# Patient Record
Sex: Female | Born: 1975
Health system: Southern US, Community
[De-identification: ages and names within clinical notes are randomized; demographics above are authoritative.]

## PROBLEM LIST (undated history)

## (undated) ENCOUNTER — Inpatient Hospital Stay (HOSPITAL_COMMUNITY): Payer: Self-pay

## (undated) DIAGNOSIS — O139 Gestational [pregnancy-induced] hypertension without significant proteinuria, unspecified trimester: Secondary | ICD-10-CM

## (undated) DIAGNOSIS — M5136 Other intervertebral disc degeneration, lumbar region: Secondary | ICD-10-CM

## (undated) DIAGNOSIS — K8020 Calculus of gallbladder without cholecystitis without obstruction: Secondary | ICD-10-CM

## (undated) DIAGNOSIS — M51369 Other intervertebral disc degeneration, lumbar region without mention of lumbar back pain or lower extremity pain: Secondary | ICD-10-CM

## (undated) HISTORY — DX: Other intervertebral disc degeneration, lumbar region: M51.36

## (undated) HISTORY — DX: Other intervertebral disc degeneration, lumbar region without mention of lumbar back pain or lower extremity pain: M51.369

## (undated) HISTORY — PX: CHOLECYSTECTOMY: SHX55

## (undated) HISTORY — DX: Calculus of gallbladder without cholecystitis without obstruction: K80.20

---

## 2004-12-12 ENCOUNTER — Other Ambulatory Visit: Admission: RE | Admit: 2004-12-12 | Discharge: 2004-12-12 | Payer: Self-pay | Admitting: Obstetrics and Gynecology

## 2005-06-09 ENCOUNTER — Emergency Department (HOSPITAL_COMMUNITY): Admission: EM | Admit: 2005-06-09 | Discharge: 2005-06-09 | Payer: Self-pay | Admitting: Family Medicine

## 2006-07-02 ENCOUNTER — Emergency Department (HOSPITAL_COMMUNITY): Admission: EM | Admit: 2006-07-02 | Discharge: 2006-07-02 | Payer: Self-pay | Admitting: *Deleted

## 2007-09-10 ENCOUNTER — Ambulatory Visit (HOSPITAL_COMMUNITY): Admission: RE | Admit: 2007-09-10 | Discharge: 2007-09-10 | Payer: Self-pay | Admitting: Family Medicine

## 2007-09-28 ENCOUNTER — Encounter (INDEPENDENT_AMBULATORY_CARE_PROVIDER_SITE_OTHER): Payer: Self-pay | Admitting: General Surgery

## 2007-09-28 ENCOUNTER — Ambulatory Visit (HOSPITAL_COMMUNITY): Admission: RE | Admit: 2007-09-28 | Discharge: 2007-09-28 | Payer: Self-pay | Admitting: General Surgery
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
## 2010-08-27 NOTE — Op Note (Signed)
Kimberly [name], Kimberly [name]               ACCOUNT NO.:  [id number]   MEDICAL RECORD NO.:  [id number]          PATIENT TYPE:  AMB   LOCATION:  SDS                          FACILITY:  [facility]   PHYSICIAN:  Ollen Gross. Vernell Morgans, M.D. DATE OF BIRTH:  1975-12-06   DATE OF PROCEDURE:  09/28/2007  DATE OF DISCHARGE:  09/28/2007                               OPERATIVE REPORT   PREOPERATIVE DIAGNOSIS:  Gallstones.   POSTOPERATIVE DIAGNOSIS:  Gallstones.   PROCEDURE:  Laparoscopic cholecystectomy with intraoperative  cholangiogram.   SURGEON:  Ollen Gross. Vernell Morgans, MD   ANESTHESIA:  General endotracheal.   PROCEDURE:  After informed consent was obtained, the patient was brought  to the operating room, placed in a supine position on the operating room  table.  After adequate induction of general anesthesia, the patient's  abdomen was prepped with Betadine and draped in usual sterile manner.  The area below the umbilicus was infiltrated with 0.25% Marcaine.  A  small incision was made with 15-blade knife.  This incision carried down  through the subcutaneous tissue bluntly with a hemostat and [employer]  retractors until the linea alba was identified.  The linea alba was  incised with a 15-blade knife and each side was grasped with Kocher  clamps and elevated anteriorly.  The preperitoneal space was then probed  bluntly hemostat until the peritoneum was opened and access was gained  to the abdominal cavity.  A 0 Vicryl pursestring stitch was placed in  the fascia around the opening .  A Hasson cannula was placed through the  opening and anchored in place at the previously placed Vicryl  pursestring stitch.  The abdomen was then insufflated with carbon  dioxide without difficulty.  The patient was placed in reverse  Trendelenburg position and rotated with the right side up.  The  laparoscope was inserted through the Hasson cannula and the right upper  quadrant was inspected.  The dome of the gallbladder and  the liver were  readily identified.  The epigastric area was then infiltrated with 0.25%  Marcaine.  A small incision was made with a 15-blade knife and a 10-mm  port was placed bluntly through this incision into the abdominal cavity  under direct vision.  Sites were then chosen laterally on the right side  with placement of 5-mm ports.  Each of these areas were infiltrated with  0.25% Marcaine.  Small stab incisions were made with a 15-blade knife  and 5-mm ports were placed bluntly through these incisions into the  abdominal cavity under direct vision.  A blunt grasper was placed  through the lateral-most 5-mm port and used to grasp the dome of the  gallbladder and elevated anteriorly and superiorly.  Another blunt  grasper was placed through the other 5-mm port and used to retract on  the body and neck of the gallbladder.  a dissector was placed through  the epigastric port and using the electrocautery, the peritoneal  reflection of the gallbladder neck was opened.  blunt dissection was  then carried out in this area into the gallbladder neck.  cystic  duct  junction was readily identified and a good window was created.  A single  clip was placed on the gallbladder neck.  A small ductotomy was made  just below the clip with a laparoscopic scissors.  A 14 gauge Angiocath  was then placed percutaneously through the anterior abdominal wall under  direct vision.  A Reddick cholangiogram catheter placed through the  Angiocath and flushed.  The Reddick catheter was then placed within the  cystic duct and anchored in place to the clip.  The cholangiogram was  obtained that showed no filling defects, good emptying in duodenum and  good length on the cystic duct.  The anchoring clip and catheters were  removed from the patient.  Three clips were placed proximally on the  cystic duct and the duct was divided between the 2 sets of clips.  Posterior to this, the cystic artery was identified and  again dissected  bluntly in a circumferential manner until a good window was created.  Two clips placed proximally and distally on the artery and the artery  was divided between the two.  Next, a laparoscopic hook cautery device  was used to separate the gallbladder from liver bed.  Prior to  completely detaching the gallbladder from the liver bed, the liver bed  was inspected and several small bleeding points were coagulated with the  electrocautery until the area was completely hemostatic.  The  gallbladder was then detached, arrest away from the liver bed without  difficulty using the hook cautery.  A laparoscopic bag was inserted  through the epigastric port.  The gallbladder was placed in the bag and  the bag was sealed.  The abdomen was then irrigated with copious amounts  of saline until the effluent was clear. The laparoscope was then removed  from the epigastric port.  A gallbladder grasper was placed through the  Hasson cannula and used to grasp open the bag.  The bag with the  gallbladder was removed through the infraumbilical port without  difficulty.  The fascial defect was then closed with a previously placed  Vicryl pursestring stitch as well as another figure-of-eight 0 Vicryl  stitch.  The rest of the ports were removed under direct vision and were  found to be hemostatic.  The gas was allowed to escape.  The skin  incisions were all closed with interrupted 4-0 Monocryl subcuticular  stitches.  Dermabond dressings were applied.  The patient tolerated the  procedure well.  At the end of the case, all needle, sponge, and  instrument counts were correct.  The patient was then awakened and taken  to the recovery room in stable condition.      Ollen Gross. Vernell Morgans, M.D.  Electronically Signed     PST/MEDQ  D:  09/28/2007  T:  09/29/2007  Job:  [id number]

## 2011-01-09 LAB — BASIC METABOLIC PANEL
BUN: 9
Calcium: 9.8
Creatinine, Ser: 0.57
GFR calc Af Amer: >60

## 2011-01-09 LAB — CBC
HCT: 38.5
Hemoglobin: 13.3
MCHC: 34.6
RDW: 11 — ABNORMAL LOW

## 2013-07-13 ENCOUNTER — Ambulatory Visit (INDEPENDENT_AMBULATORY_CARE_PROVIDER_SITE_OTHER): Payer: Managed Care, Other (non HMO) | Admitting: Obstetrics & Gynecology

## 2013-07-13 ENCOUNTER — Encounter: Payer: Self-pay | Admitting: Obstetrics & Gynecology

## 2013-07-13 VITALS — BP 151/103 | HR 74 | Resp 16 | Ht 69.0 in | Wt 203.0 lb

## 2013-07-13 DIAGNOSIS — Z0000 Encounter for general adult medical examination without abnormal findings: Secondary | ICD-10-CM

## 2013-07-13 DIAGNOSIS — Z124 Encounter for screening for malignant neoplasm of cervix: Secondary | ICD-10-CM

## 2013-07-13 DIAGNOSIS — Z1151 Encounter for screening for human papillomavirus (HPV): Secondary | ICD-10-CM

## 2013-07-13 DIAGNOSIS — Z01419 Encounter for gynecological examination (general) (routine) without abnormal findings: Secondary | ICD-10-CM

## 2013-07-13 MED ORDER — DROSPIRENONE-ETHINYL ESTRADIOL 3-0.02 MG PO TABS: 1.0000 | ORAL_TABLET | Freq: Every day | ORAL | Status: DC

## 2013-07-13 NOTE — Progress Notes (Signed)
Subjective:    Kimberly MasterJessica [name] is a 38 y.o. female who presents for an annual exam. The patient has no complaints today. She needs a refill of her generic Dianah FieldYaz (she is aware of the increased risks). The patient is sexually active. GYN screening history: last pap: was normal. The patient wears seatbelts: yes. The patient participates in regular exercise: yes. Has the patient ever been transfused or tattooed?: no. The patient reports that there is not domestic violence in her life.   Menstrual History: OB History   Grav Para Term Preterm Abortions TAB SAB Ect Mult Living   0 0 0 0 0 0 0 0 0 0       Menarche age: [age]  Patient's last menstrual period was 07/01/2013.    The following portions of the patient's history were reviewed and updated as appropriate: allergies, current medications, past family history, past medical history, past social history, past surgical history and problem list.  Review of Systems A comprehensive review of systems was negative. She says that her BP is always normal and she gets it checked fairly regularly. Her FP is at [facility]. Wants a pregancy in the near future but does want a refill of OCPs today. 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   Objective:    BP 151/103  Pulse 74  Resp 16  Ht 5\' 9"  (1.753 m)  Wt 92.08 kg (203 lb)  BMI 29.96 kg/m2  LMP 07/01/2013  General Appearance:    Alert, cooperative, no distress, appears stated age  Head:    Normocephalic, without obvious abnormality, atraumatic  Eyes:    PERRL, conjunctiva/corneas clear, EOM's intact, fundi    benign, both eyes  Ears:    Normal TM's and external ear canals, both ears  Nose:   Nares normal, septum midline, mucosa normal, no drainage    or sinus tenderness  Throat:   Lips, mucosa, and tongue normal; teeth and gums normal  Neck:   Supple, symmetrical, trachea midline, no adenopathy;    thyroid:  no enlargement/tenderness/nodules; no carotid   bruit or JVD  Back:      Symmetric, no curvature, ROM normal, no CVA tenderness  Lungs:     Clear to auscultation bilaterally, respirations unlabored  Chest Wall:    No tenderness or deformity   Heart:    Regular rate and rhythm, S1 and S2 normal, no murmur, rub   or gallop  Breast Exam:    No tenderness, masses, or nipple abnormality  Abdomen:     Soft, non-tender, bowel sounds active all four quadrants,    no masses, no organomegaly  Genitalia:    Normal female without lesion, discharge or tenderness, NSSA, NT, mobile, normal adnexal exam     Extremities:   Extremities normal, atraumatic, no cyanosis or edema  Pulses:   2+ and symmetric all extremities  Skin:   Skin color, texture, turgor normal, no rashes or lesions  Lymph nodes:   Cervical, supraclavicular, and axillary nodes normal  Neurologic:   CNII-XII intact, normal strength, sensation and reflexes    throughout  .    Assessment:    Healthy female exam.    Plan:     Breast self exam technique reviewed and patient encouraged to perform self-exam monthly. Thin prep Pap smear. with cotesting Continue PNVs

## 2014-07-21 ENCOUNTER — Telehealth: Payer: Self-pay | Admitting: General Practice

## 2014-07-21 NOTE — Telephone Encounter (Signed)
Patient called and left message stating she needs a refill on her Kimberly [name] and also needs an appt to be seen as well for her yearly, which she will call back for. She just needs enough to get her until then.

## 2014-07-25 ENCOUNTER — Other Ambulatory Visit: Payer: Self-pay | Admitting: *Deleted

## 2014-07-25 DIAGNOSIS — Z3041 Encounter for surveillance of contraceptive pills: Secondary | ICD-10-CM

## 2014-07-25 MED ORDER — DROSPIRENONE-ETHINYL ESTRADIOL 3-0.02 MG PO TABS: 1.0000 | ORAL_TABLET | Freq: Every day | ORAL | Status: DC

## 2014-07-25 NOTE — Telephone Encounter (Signed)
Rf on Loryna x 1 until her annual appt.

## 2014-08-10 ENCOUNTER — Encounter: Payer: Self-pay | Admitting: Obstetrics & Gynecology

## 2014-08-10 ENCOUNTER — Ambulatory Visit (INDEPENDENT_AMBULATORY_CARE_PROVIDER_SITE_OTHER): Payer: Managed Care, Other (non HMO) | Admitting: Obstetrics & Gynecology

## 2014-08-10 VITALS — BP 128/85 | HR 71 | Resp 16 | Ht 69.0 in | Wt 187.0 lb

## 2014-08-10 DIAGNOSIS — Z01419 Encounter for gynecological examination (general) (routine) without abnormal findings: Secondary | ICD-10-CM

## 2014-08-10 DIAGNOSIS — Z3041 Encounter for surveillance of contraceptive pills: Secondary | ICD-10-CM

## 2014-08-10 DIAGNOSIS — Z1151 Encounter for screening for human papillomavirus (HPV): Secondary | ICD-10-CM | POA: Diagnosis not present

## 2014-08-10 DIAGNOSIS — Z0000 Encounter for general adult medical examination without abnormal findings: Secondary | ICD-10-CM

## 2014-08-10 DIAGNOSIS — Z124 Encounter for screening for malignant neoplasm of cervix: Secondary | ICD-10-CM

## 2014-08-10 MED ORDER — DROSPIRENONE-ETHINYL ESTRADIOL 3-0.02 MG PO TABS: 1.0000 | ORAL_TABLET | Freq: Every day | ORAL | Status: DC
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
## 2014-08-15 LAB — CYTOLOGY - PAP

## 2015-03-19 ENCOUNTER — Other Ambulatory Visit: Payer: Self-pay | Admitting: Obstetrics & Gynecology

## 2015-08-07 ENCOUNTER — Other Ambulatory Visit: Payer: Self-pay | Admitting: Obstetrics & Gynecology

## 2015-08-09 ENCOUNTER — Other Ambulatory Visit: Payer: Self-pay | Admitting: *Deleted

## 2015-08-09 DIAGNOSIS — Z3041 Encounter for surveillance of contraceptive pills: Secondary | ICD-10-CM

## 2015-08-09 MED ORDER — DROSPIRENONE-ETHINYL ESTRADIOL 3-0.02 MG PO TABS: 1.0000 | ORAL_TABLET | Freq: Every day | ORAL | Status: DC

## 2015-08-09 NOTE — Telephone Encounter (Signed)
RF authorization sent to [facility] for Loryna.  Pt has appt in may for annual

## 2015-08-13 ENCOUNTER — Ambulatory Visit: Payer: Managed Care, Other (non HMO) | Admitting: Obstetrics & Gynecology

## 2015-08-20 ENCOUNTER — Ambulatory Visit (INDEPENDENT_AMBULATORY_CARE_PROVIDER_SITE_OTHER): Payer: PRIVATE HEALTH INSURANCE | Admitting: Obstetrics & Gynecology

## 2015-08-20 ENCOUNTER — Encounter: Payer: Self-pay | Admitting: Obstetrics & Gynecology

## 2015-08-20 VITALS — BP 162/93 | HR 90 | Ht 68.0 in | Wt 195.0 lb

## 2015-08-20 DIAGNOSIS — Z124 Encounter for screening for malignant neoplasm of cervix: Secondary | ICD-10-CM

## 2015-08-20 DIAGNOSIS — Z01419 Encounter for gynecological examination (general) (routine) without abnormal findings: Secondary | ICD-10-CM | POA: Diagnosis not present

## 2015-08-20 DIAGNOSIS — Z3041 Encounter for surveillance of contraceptive pills: Secondary | ICD-10-CM | POA: Diagnosis not present

## 2015-08-20 DIAGNOSIS — Z1151 Encounter for screening for human papillomavirus (HPV): Secondary | ICD-10-CM

## 2015-08-20 DIAGNOSIS — Z0000 Encounter for general adult medical examination without abnormal findings: Secondary | ICD-10-CM

## 2015-08-20 MED ORDER — RIZATRIPTAN BENZOATE 10 MG PO TABS: 10.0000 mg | ORAL_TABLET | ORAL | Status: DC | PRN

## 2015-08-20 NOTE — Progress Notes (Signed)
Subjective:    Kimberly [name] is a 40 y.o. MW G0 female who presents for an annual exam. The patient has no complaints today. The patient is sexually active. GYN screening history: last pap: was normal. The patient wears seatbelts: yes. The patient participates in regular exercise: yes. Has the patient ever been transfused or tattooed?: not asked. The patient reports that there is not domestic violence in her life.   Menstrual History: OB History    Gravida Para Term Preterm AB TAB SAB Ectopic Multiple Living   0 0 0 0 0 0 0 0 0 0       Menarche age: [age]  Patient's last menstrual period was 08/14/2015.    The following portions of the patient's history were reviewed and updated as appropriate: allergies, current medications, past family history, past medical history, past social history, past surgical history and problem list.  Review of Systems Pertinent items are noted in HPI. Recently married, monogamous for 5 years, planning a pregnancy this year.    Objective:    Pulse 90  Ht 5\' 8"  (1.727 m)  Wt 195 lb (88.451 kg)  BMI 29.66 kg/m2  LMP 08/14/2015  General Appearance:    Alert, cooperative, no distress, appears stated age  Head:    Normocephalic, without obvious abnormality, atraumatic  Eyes:    PERRL, conjunctiva/corneas clear, EOM's intact, fundi    benign, both eyes  Ears:    Normal TM's and external ear canals, both ears  Nose:   Nares normal, septum midline, mucosa normal, no drainage    or sinus tenderness  Throat:   Lips, mucosa, and tongue normal; teeth and gums normal  Neck:   Supple, symmetrical, trachea midline, no adenopathy;    thyroid:  no enlargement/tenderness/nodules; no carotid   bruit or JVD  Back:     Symmetric, no curvature, ROM normal, no CVA tenderness  Lungs:     Clear to auscultation bilaterally, respirations unlabored  Chest Wall:    No tenderness or deformity   Heart:    Regular rate and rhythm, S1 and S2 normal, no murmur, rub   or gallop   Breast Exam:    No tenderness, masses, or nipple abnormality  Abdomen:     Soft, non-tender, bowel sounds active all four quadrants,    no masses, no organomegaly  Genitalia:    Normal female without lesion, discharge or tenderness, NSSA, NT, mobile, normal adnexal exam     Extremities:   Extremities normal, atraumatic, no cyanosis or edema  Pulses:   2+ and symmetric all extremities  Skin:   Skin color, texture, turgor normal, no rashes or lesions  Lymph nodes:   Cervical, supraclavicular, and axillary nodes normal  Neurologic:   CNII-XII intact, normal strength, sensation and reflexes    throughout  .    Assessment:    Healthy female exam.    Plan:     Thin prep Pap smear. with cotesting Continue PNVs daily Stop OCPs when she desires pregnancy We discussed the average time for conception of 1 year

## 2015-08-22 LAB — CYTOLOGY - PAP

## 2015-08-31 ENCOUNTER — Ambulatory Visit: Payer: PRIVATE HEALTH INSURANCE

## 2015-08-31 VITALS — BP 122/80 | HR 88

## 2015-08-31 DIAGNOSIS — Z0130 Encounter for examination of blood pressure without abnormal findings: Secondary | ICD-10-CM

## 2015-08-31 NOTE — Progress Notes (Signed)
Patient presents for blood pressure check. Patient made aware within normal range. Kimberly StammerJennifer Jizelle Conkey RN BSN

## 2015-10-04 ENCOUNTER — Telehealth (HOSPITAL_COMMUNITY): Payer: Self-pay | Admitting: Obstetrics & Gynecology

## 2015-10-04 DIAGNOSIS — Z3041 Encounter for surveillance of contraceptive pills: Secondary | ICD-10-CM

## 2015-10-04 MED ORDER — DROSPIRENONE-ETHINYL ESTRADIOL 3-0.02 MG PO TABS: 1.0000 | ORAL_TABLET | Freq: Every day | ORAL | Status: DC

## 2015-10-04 NOTE — Telephone Encounter (Signed)
Patient called regarding her BC refills.  She was told the refill was sent in on 08/20/15 at the time of her annual exam.  Did not show refill was generated.  [facility] [place].

## 2015-10-25 ENCOUNTER — Other Ambulatory Visit: Payer: Self-pay | Admitting: Obstetrics & Gynecology

## 2015-12-11 ENCOUNTER — Telehealth: Payer: Self-pay | Admitting: *Deleted

## 2015-12-11 DIAGNOSIS — G43911 Migraine, unspecified, intractable, with status migrainosus: Secondary | ICD-10-CM

## 2015-12-11 MED ORDER — RIZATRIPTAN BENZOATE 10 MG PO TABS: 10.0000 mg | ORAL_TABLET | ORAL | 2 refills | Status: DC | PRN

## 2015-12-11 NOTE — Telephone Encounter (Signed)
Pt called and requested her Maxalt be sent to [facility] for a 90 day supply.

## 2016-02-07 ENCOUNTER — Encounter: Payer: Self-pay | Admitting: *Deleted

## 2016-04-14 NOTE — L&D Delivery Note (Signed)
Patient is 41 y.o. G1P0000 4752w1d admitted for IOL for preeclampsia with severe features. S/p IOL with foley bulb, cytotec, followed by Pitocin. AROM at 1739.   Delivery Note At 2:09 AM a viable female was delivered via Vaginal, Spontaneous Delivery (Presentation: cephalic; ROA).  APGAR: 9, 9; weight pending.   Placenta status: grossly normal, intact.  Cord: 3 vessel without complications.  Cord pH: not drawn.  Anesthesia: Epidural Episiotomy: None Lacerations: 2nd degree;Perineal Suture Repair: vicryl rapide Est. Blood Loss (mL):  475  Upon arrival patient was complete and pushing. She pushed with good maternal effort to deliver a viable female infant in cephalic, ROA position. No nuchal cord present.  Baby delivered without difficulty (anterior shoulder delivered with ease), was noted to have good tone and placed on maternal abdomen for oral suctioning, drying and stimulation. Delayed cord clamping performed. Placenta delivered spontaneously with gentle cord traction. Fundus firm with massage and Pitocin. Perineum inspected and found to have deep 2nd degree perineal laceration and two bilateral sulci lacerations, which were repaired with vicryl rapide with good hemostasis achieved. Counts of sharps, instruments, and lap pads were all correct.  Mom to postpartum.  Baby to Couplet care / Skin to Skin.  Amanda C. Frances FurbishWinfrey, MD PGY-1, [facility] 12/18/2016 2:44 AM

## 2016-05-06 ENCOUNTER — Telehealth: Payer: Self-pay

## 2016-05-06 NOTE — Telephone Encounter (Signed)
Pt called and was concerned because she is [redacted] weeks pregnant and went to urinate and saw blood when she wiped. I asked her if she had recently had intercourse and she stated that she had not. She stated that this was the first time she had noticed any bleeding since becoming pregnant. I asked her if she was experiencing any cramping or any pain at all and she denied feeling any pain. I spoke with Mariel AloeLora Clark, RN and she told me that sometimes spotting can be normal in pregnancy but, to tell the pt to go to ED if the bleeding increased. The pt asked to speak to a doctor so Elsie LincolnKelly Leggett, MD spoke with her on the phone. Dr.Leggett explained ectopic precautions to her and told her that if she felt the need to go the ED then she can do so but, we could draw BHCG and sent it stat tomorrow morning. Dr.Leggett told her to go to the hospital if bleeding increased or if she started to experience pain.

## 2016-05-07 ENCOUNTER — Other Ambulatory Visit (INDEPENDENT_AMBULATORY_CARE_PROVIDER_SITE_OTHER): Payer: PRIVATE HEALTH INSURANCE

## 2016-05-07 DIAGNOSIS — O209 Hemorrhage in early pregnancy, unspecified: Secondary | ICD-10-CM | POA: Diagnosis not present

## 2016-05-08 LAB — HCG, QUANTITATIVE, PREGNANCY: hCG, Beta Chain, Quant, S: 25473.7 m[IU]/mL — ABNORMAL HIGH

## 2016-05-09 ENCOUNTER — Other Ambulatory Visit: Payer: PRIVATE HEALTH INSURANCE

## 2016-05-20 ENCOUNTER — Ambulatory Visit (INDEPENDENT_AMBULATORY_CARE_PROVIDER_SITE_OTHER): Payer: PRIVATE HEALTH INSURANCE | Admitting: Obstetrics & Gynecology

## 2016-05-20 ENCOUNTER — Encounter: Payer: Self-pay | Admitting: Obstetrics & Gynecology

## 2016-05-20 VITALS — BP 129/85 | HR 75 | Wt 193.0 lb

## 2016-05-20 DIAGNOSIS — O09511 Supervision of elderly primigravida, first trimester: Secondary | ICD-10-CM

## 2016-05-20 DIAGNOSIS — O09513 Supervision of elderly primigravida, third trimester: Secondary | ICD-10-CM | POA: Insufficient documentation

## 2016-05-20 DIAGNOSIS — Z3689 Encounter for other specified antenatal screening: Secondary | ICD-10-CM

## 2016-05-20 DIAGNOSIS — Z3403 Encounter for supervision of normal first pregnancy, third trimester: Secondary | ICD-10-CM | POA: Insufficient documentation

## 2016-05-20 DIAGNOSIS — Z3491 Encounter for supervision of normal pregnancy, unspecified, first trimester: Secondary | ICD-10-CM

## 2016-05-20 NOTE — Progress Notes (Signed)
  PanormaBedside U/S today shows IUP with FHT of sac 1 of 168 BPM and CRL is 16.526mm  Sac 2 shows smal gestational sac with yolk sac but no fetal pole.

## 2016-05-20 NOTE — Progress Notes (Signed)
  Subjective:    Kimberly [name] is a MW G1P0000 4070w1d being seen today for her first obstetrical visit.  Her obstetrical history is significant for advanced maternal age and obesity. Patient does intend to breast feed. Pregnancy history fully reviewed.  Patient reports no complaints.  Vitals:   05/20/16 1354  BP: 129/85  Pulse: 75  Weight: 193 lb (87.5 kg)    HISTORY: OB History  Gravida Para Term Preterm AB Living  1 0 0 0 0 0  SAB TAB Ectopic Multiple Live Births  0 0 0 0      # Outcome Date GA Lbr Len/2nd Weight Sex Delivery Anes PTL Lv  1 Current              Past Medical History:  Diagnosis Date  . Gallstones    Past Surgical History:  Procedure Laterality Date  . CHOLECYSTECTOMY     Family History  Problem Relation Age of Onset  . Heart disease Mother   . Hypertension Father   . Heart disease Father   . Hypertension Maternal Grandmother      Exam    Uterus:     Pelvic Exam:    Perineum: No Hemorrhoids   Vulva: normal   Vagina:  normal mucosa   pH:    Cervix: anteverted   Adnexa: normal adnexa   Bony Pelvis: android  System: Breast:  normal appearance, no masses or tenderness   Skin: normal coloration and turgor, no rashes    Neurologic: oriented   Extremities: normal strength, tone, and muscle mass   HEENT PERRLA   Mouth/Teeth mucous membranes moist, pharynx normal without lesions   Neck supple   Cardiovascular: regular rate and rhythm   Respiratory:  appears well, vitals normal, no respiratory distress, acyanotic, normal RR, ear and throat exam is normal, neck free of mass or lymphadenopathy, chest clear, no wheezing, crepitations, rhonchi, normal symmetric air entry   Abdomen: soft, non-tender; bowel sounds normal; no masses,  no organomegaly   Urinary: urethral meatus normal      Assessment:    Pregnancy: G1P0000 Patient Active Problem List   Diagnosis Date Noted  . Encounter for supervision of normal pregnancy in first trimester  05/20/2016  . Elderly primigravida in first trimester 05/20/2016        Plan:     Initial labs drawn. Prenatal vitamins. Problem list reviewed and updated. Genetic Screening discussed: plans for NIPS and MSAFP  Ultrasound discussed; fetal survey: requested.  Follow up in 4 weeks. Discussed rec'd weight gain of less than 25#  Usha Slager C Darilyn Storbeck 05/20/2016

## 2016-05-21 LAB — PRENATAL PROFILE (SOLSTAS)
ANTIBODY SCREEN: NEGATIVE
BASOS PCT: 0 %
Basophils Absolute: 0 cells/uL (ref 0–200)
EOS ABS: 146 {cells}/uL (ref 15–500)
Eosinophils Relative: 2 %
HCT: 36 % (ref 35.0–45.0)
HIV 1&2 Ab, 4th Generation: NONREACTIVE
Hemoglobin: 12.3 g/dL (ref 11.7–15.5)
Hepatitis B Surface Ag: NEGATIVE
LYMPHS PCT: 34 %
Lymphs Abs: 2482 cells/uL (ref 850–3900)
MCH: 32.5 pg (ref 27.0–33.0)
MCHC: 34.2 g/dL (ref 32.0–36.0)
MCV: 95.2 fL (ref 80.0–100.0)
MONOS PCT: 7 %
MPV: 9.7 fL (ref 7.5–12.5)
Monocytes Absolute: 511 cells/uL (ref 200–950)
NEUTROS PCT: 57 %
Neutro Abs: 4161 cells/uL (ref 1500–7800)
PLATELETS: 247 10*3/uL (ref 140–400)
RBC: 3.78 MIL/uL — ABNORMAL LOW (ref 3.80–5.10)
RDW: 12.3 % (ref 11.0–15.0)
RH TYPE: POSITIVE
WBC: 7.3 10*3/uL (ref 3.8–10.8)

## 2016-05-21 LAB — HEMOGLOBIN A1C
HEMOGLOBIN A1C: 4.7 % (ref <5.7)
MEAN PLASMA GLUCOSE: 88 mg/dL

## 2016-05-21 LAB — PAIN MGMT, PROFILE 6 CONF W/O MM, U
6 ACETYLMORPHINE: NEGATIVE ng/mL (ref <10)
AMPHETAMINES: NEGATIVE ng/mL (ref <500)
Alcohol Metabolites: NEGATIVE ng/mL (ref <500)
BARBITURATES: NEGATIVE ng/mL (ref <300)
Benzodiazepines: NEGATIVE ng/mL (ref <100)
Cocaine Metabolite: NEGATIVE ng/mL (ref <150)
Creatinine: 90 mg/dL (ref >=20.0)
Marijuana Metabolite: NEGATIVE ng/mL (ref <20)
Methadone Metabolite: NEGATIVE ng/mL (ref <100)
OPIATES: NEGATIVE ng/mL (ref <100)
OXIDANT: NEGATIVE ug/mL (ref <200)
Oxycodone: NEGATIVE ng/mL (ref <100)
PLEASE NOTE: 0
Phencyclidine: NEGATIVE ng/mL (ref <25)
pH: 7.03 (ref 4.5–9.0)

## 2016-05-21 LAB — GLUCOSE, RANDOM: GLUCOSE: 97 mg/dL (ref 65–99)

## 2016-05-22 LAB — GC/CHLAMYDIA PROBE AMP (~~LOC~~) NOT AT ARMC
Chlamydia: NEGATIVE
Neisseria Gonorrhea: NEGATIVE

## 2016-05-22 LAB — CULTURE, OB URINE: ORGANISM ID, BACTERIA: NO GROWTH

## 2016-05-29 LAB — CFVANTAGE CYSTIC FIB EXP SCREEN

## 2016-06-09 ENCOUNTER — Encounter: Payer: Self-pay | Admitting: *Deleted

## 2016-06-18 ENCOUNTER — Ambulatory Visit (INDEPENDENT_AMBULATORY_CARE_PROVIDER_SITE_OTHER): Payer: PRIVATE HEALTH INSURANCE | Admitting: Obstetrics & Gynecology

## 2016-06-18 VITALS — Wt 195.0 lb

## 2016-06-18 DIAGNOSIS — O09511 Supervision of elderly primigravida, first trimester: Secondary | ICD-10-CM

## 2016-06-18 DIAGNOSIS — Z3491 Encounter for supervision of normal pregnancy, unspecified, first trimester: Secondary | ICD-10-CM

## 2016-06-18 NOTE — Progress Notes (Signed)
   PRENATAL VISIT NOTE  Subjective:  Kimberly [name] is a 41 y.o.MW  G1P0000 at 4065w2d being seen today for ongoing prenatal care.  She is currently monitored for the following issues for this low-risk pregnancy and has Encounter for supervision of normal pregnancy in first trimester and Elderly primigravida in first trimester on her problem list.  Patient reports no complaints.   . Vag. Bleeding: None.  Movement: Absent. Denies leaking of fluid.   The following portions of the patient's history were reviewed and updated as appropriate: allergies, current medications, past family history, past medical history, past social history, past surgical history and problem list. Problem list updated.  Objective:   Vitals:   06/18/16 0830  Weight: 195 lb (88.5 kg)    Fetal Status: Fetal Heart Rate (bpm): 164   Movement: Absent     General:  Alert, oriented and cooperative. Patient is in no acute distress.  Skin: Skin is warm and dry. No rash noted.   Cardiovascular: Normal heart rate noted  Respiratory: Normal respiratory effort, no problems with respiration noted  Abdomen: Soft, gravid, appropriate for gestational age. Pain/Pressure: Present     Pelvic:  Cervical exam deferred        Extremities: Normal range of motion.  Edema: None  Mental Status: Normal mood and affect. Normal behavior. Normal judgment and thought content.   Assessment and Plan:  Pregnancy: G1P0000 at 4065w2d  1. Elderly primigravida in first trimester - NIPS today, MSAFP at next visit  2. Encounter for supervision of normal pregnancy in first trimester, unspecified gravidity   Preterm labor symptoms and general obstetric precautions including but not limited to vaginal bleeding, contractions, leaking of fluid and fetal movement were reviewed in detail with the patient. Please refer to After Visit Summary for other counseling recommendations.  No Follow-up on file.   Allie BossierMyra C Laura-Lee Villegas, MD

## 2016-06-30 ENCOUNTER — Encounter: Payer: Self-pay | Admitting: *Deleted

## 2016-07-15 ENCOUNTER — Encounter: Payer: PRIVATE HEALTH INSURANCE | Admitting: Obstetrics & Gynecology

## 2016-07-18 ENCOUNTER — Ambulatory Visit (INDEPENDENT_AMBULATORY_CARE_PROVIDER_SITE_OTHER): Payer: PRIVATE HEALTH INSURANCE | Admitting: Advanced Practice Midwife

## 2016-07-18 VITALS — BP 131/78 | Wt 198.0 lb

## 2016-07-18 DIAGNOSIS — O09522 Supervision of elderly multigravida, second trimester: Secondary | ICD-10-CM

## 2016-07-18 DIAGNOSIS — Z3401 Encounter for supervision of normal first pregnancy, first trimester: Secondary | ICD-10-CM

## 2016-07-18 DIAGNOSIS — Z363 Encounter for antenatal screening for malformations: Secondary | ICD-10-CM

## 2016-07-18 DIAGNOSIS — Z3A18 18 weeks gestation of pregnancy: Secondary | ICD-10-CM

## 2016-07-18 DIAGNOSIS — Z3491 Encounter for supervision of normal pregnancy, unspecified, first trimester: Secondary | ICD-10-CM

## 2016-07-18 NOTE — Patient Instructions (Signed)

## 2016-07-21 LAB — ALPHA FETOPROTEIN, MATERNAL
AFP: 26.5 ng/mL
Curr Gest Age: 16.6 weeks
MoM for AFP: 0.77
OPEN SPINA BIFIDA: NEGATIVE

## 2016-07-22 NOTE — Progress Notes (Signed)
   PRENATAL VISIT NOTE  Subjective:  KATELYNNE REVAK is a 41 y.o. G1P0000 at [redacted]w[redacted]d being seen today for ongoing prenatal care.  She is currently monitored for the following issues for this high-risk pregnancy and has Encounter for supervision of normal pregnancy in first trimester and Elderly primigravida in first trimester on her problem list.  Patient reports no complaints.  Contractions: Not present. Vag. Bleeding: None.  Movement: Absent. Denies leaking of fluid.   The following portions of the patient's history were reviewed and updated as appropriate: allergies, current medications, past family history, past medical history, past social history, past surgical history and problem list. Problem list updated.  Objective:   Vitals:   07/18/16 0820  BP: 131/78  Weight: 198 lb (89.8 kg)    Fetal Status: Fetal Heart Rate (bpm): 147 Fundal Height: 18 cm Movement: Absent     General:  Alert, oriented and cooperative. Patient is in no acute distress.  Skin: Skin is warm and dry. No rash noted.   Cardiovascular: Normal heart rate noted  Respiratory: Normal respiratory effort, no problems with respiration noted  Abdomen: Soft, gravid, appropriate for gestational age. Pain/Pressure: Present     Pelvic:  Cervical exam deferred        Extremities: Normal range of motion.  Edema: None  Mental Status: Normal mood and affect. Normal behavior. Normal judgment and thought content.   Assessment and Plan:  Pregnancy: G1P0000 at [redacted]w[redacted]d  1. Encounter for supervision of normal pregnancy in first trimester, unspecified gravidity  - Alpha fetoprotein, maternal - [place] MFM OB DETAIL +14 WK; Future  2. AMA (advanced maternal age) multigravida 35+, second trimester  - [place] [facility] OB DETAIL +14 WK; Future  3. Antenatal screening for malformation using ultrasonics  - [place] [facility] DETAIL +14 WK; Future  4. [redacted] weeks gestation of pregnancy  - [place] [facility] OB DETAIL +14 WK; Future  Preterm labor symptoms and general  obstetric precautions including but not limited to vaginal bleeding, contractions, leaking of fluid and fetal movement were reviewed in detail with the patient. Please refer to After Visit Summary for other counseling recommendations.  Return in about 4 weeks (around 08/15/2016) for ROB.   Dorathy Kinsman, CNM

## 2016-08-05 ENCOUNTER — Other Ambulatory Visit: Payer: Self-pay | Admitting: Advanced Practice Midwife

## 2016-08-05 ENCOUNTER — Encounter (HOSPITAL_COMMUNITY): Payer: Self-pay

## 2016-08-05 ENCOUNTER — Other Ambulatory Visit (HOSPITAL_COMMUNITY): Payer: Self-pay | Admitting: *Deleted

## 2016-08-05 ENCOUNTER — Ambulatory Visit (HOSPITAL_COMMUNITY)
Admission: RE | Admit: 2016-08-05 | Discharge: 2016-08-05 | Disposition: A | Discharge status: Home / Self Care | Payer: PRIVATE HEALTH INSURANCE | Source: Ambulatory Visit | Attending: Advanced Practice Midwife | Admitting: Advanced Practice Midwife

## 2016-08-05 DIAGNOSIS — Z3491 Encounter for supervision of normal pregnancy, unspecified, first trimester: Secondary | ICD-10-CM

## 2016-08-05 DIAGNOSIS — Z3A18 18 weeks gestation of pregnancy: Secondary | ICD-10-CM | POA: Diagnosis not present

## 2016-08-05 DIAGNOSIS — O99212 Obesity complicating pregnancy, second trimester: Secondary | ICD-10-CM | POA: Diagnosis not present

## 2016-08-05 DIAGNOSIS — O09522 Supervision of elderly multigravida, second trimester: Secondary | ICD-10-CM

## 2016-08-05 DIAGNOSIS — O09512 Supervision of elderly primigravida, second trimester: Secondary | ICD-10-CM | POA: Insufficient documentation

## 2016-08-05 DIAGNOSIS — Z363 Encounter for antenatal screening for malformations: Secondary | ICD-10-CM

## 2016-08-13 ENCOUNTER — Ambulatory Visit (INDEPENDENT_AMBULATORY_CARE_PROVIDER_SITE_OTHER): Payer: PRIVATE HEALTH INSURANCE | Admitting: Obstetrics & Gynecology

## 2016-08-13 VITALS — BP 128/74 | HR 78 | Wt 199.0 lb

## 2016-08-13 DIAGNOSIS — Z3401 Encounter for supervision of normal first pregnancy, first trimester: Secondary | ICD-10-CM

## 2016-08-13 DIAGNOSIS — O09511 Supervision of elderly primigravida, first trimester: Secondary | ICD-10-CM

## 2016-08-13 NOTE — Progress Notes (Signed)
   PRENATAL VISIT NOTE  Subjective:  Kimberly [name] is a 41 y.o. G1P0000 at [redacted]w[redacted]d being seen today for ongoing prenatal care.  She is currently monitored for the following issues for this low-risk pregnancy and has Encounter for supervision of normal pregnancy in first trimester and Elderly primigravida in first trimester on her problem list.  Patient reports no complaints.  Contractions: Not present. Vag. Bleeding: None.  Movement: Present. Denies leaking of fluid.   The following portions of the patient's history were reviewed and updated as appropriate: allergies, current medications, past family history, past medical history, past social history, past surgical history and problem list. Problem list updated.  Objective:   Vitals:   08/13/16 0847  BP: 128/74  Pulse: 78  Weight: 199 lb (90.3 kg)    Fetal Status:     Movement: Present     General:  Alert, oriented and cooperative. Patient is in no acute distress.  Skin: Skin is warm and dry. No rash noted.   Cardiovascular: Normal heart rate noted  Respiratory: Normal respiratory effort, no problems with respiration noted  Abdomen: Soft, gravid, appropriate for gestational age. Pain/Pressure: Present     Pelvic:  Cervical exam deferred        Extremities: Normal range of motion.  Edema: None  Mental Status: Normal mood and affect. Normal behavior. Normal judgment and thought content.   Assessment and Plan:  Pregnancy: G1P0000 at [redacted]w[redacted]d  1. Elderly primigravida in first trimester   2. Encounter for supervision of normal first pregnancy in first trimester   Preterm labor symptoms and general obstetric precautions including but not limited to vaginal bleeding, contractions, leaking of fluid and fetal movement were reviewed in detail with the patient. Please refer to After Visit Summary for other counseling recommendations.  No Follow-up on file.   Allie Bossier, MD

## 2016-08-14 ENCOUNTER — Encounter: Payer: PRIVATE HEALTH INSURANCE | Admitting: Obstetrics & Gynecology

## 2016-09-16 ENCOUNTER — Ambulatory Visit (INDEPENDENT_AMBULATORY_CARE_PROVIDER_SITE_OTHER): Payer: PRIVATE HEALTH INSURANCE | Admitting: Obstetrics & Gynecology

## 2016-09-16 VITALS — BP 132/76 | HR 80 | Wt 208.0 lb

## 2016-09-16 DIAGNOSIS — O09511 Supervision of elderly primigravida, first trimester: Secondary | ICD-10-CM

## 2016-09-16 DIAGNOSIS — Z3401 Encounter for supervision of normal first pregnancy, first trimester: Secondary | ICD-10-CM

## 2016-09-16 NOTE — Progress Notes (Signed)
   PRENATAL VISIT NOTE  Subjective:  Kimberly [name] is a 41 y.o. MW G1P0000 at 7687w6d being seen today for ongoing prenatal care.  She is currently monitored for the following issues for this low-risk pregnancy and has Encounter for supervision of normal pregnancy in first trimester and Elderly primigravida in first trimester on her problem list.  Patient reports no complaints.  Contractions: Not present. Vag. Bleeding: None.  Movement: Present. Denies leaking of fluid.   The following portions of the patient's history were reviewed and updated as appropriate: allergies, current medications, past family history, past medical history, past social history, past surgical history and problem list. Problem list updated.  Objective:   Vitals:   09/16/16 0810  BP: 132/76  Pulse: 80  Weight: 208 lb (94.3 kg)    Fetal Status: Fetal Heart Rate (bpm): 138   Movement: Present     General:  Alert, oriented and cooperative. Patient is in no acute distress.  Skin: Skin is warm and dry. No rash noted.   Cardiovascular: Normal heart rate noted  Respiratory: Normal respiratory effort, no problems with respiration noted  Abdomen: Soft, gravid, appropriate for gestational age. Pain/Pressure: Present     Pelvic:  Cervical exam deferred        Extremities: Normal range of motion.  Edema: Trace  Mental Status: Normal mood and affect. Normal behavior. Normal judgment and thought content.   Assessment and Plan:  Pregnancy: G1P0000 at 4487w6d - 2 hour GTT at next visit There are no diagnoses linked to this encounter. Preterm labor symptoms and general obstetric precautions including but not limited to vaginal bleeding, contractions, leaking of fluid and fetal movement were reviewed in detail with the patient. Please refer to After Visit Summary for other counseling recommendations.  No Follow-up on file.   Allie BossierMyra C Donise Woodle, MD

## 2016-10-03 ENCOUNTER — Telehealth: Payer: Self-pay | Admitting: *Deleted

## 2016-10-03 DIAGNOSIS — B373 Candidiasis of vulva and vagina: Secondary | ICD-10-CM

## 2016-10-03 DIAGNOSIS — B3731 Acute candidiasis of vulva and vagina: Secondary | ICD-10-CM

## 2016-10-03 MED ORDER — TERCONAZOLE 0.4 % VA CREA: 1.0000 | TOPICAL_CREAM | Freq: Every day | VAGINAL | 0 refills | Status: DC

## 2016-10-03 NOTE — Telephone Encounter (Signed)
-----   Message from Bonnetta BarryMarva R Chisholm sent at 10/03/2016  8:11 AM EDT ----- pls call , pt thinks she has a yeast infection

## 2016-10-03 NOTE — Telephone Encounter (Signed)
Pt called stating that she is becoming symptomatic for vaginal yeast and due to the weekend would like to go ahead and get started on something.  Per protocol Terazol cream was sent to her pharmacy.

## 2016-10-08 ENCOUNTER — Ambulatory Visit (INDEPENDENT_AMBULATORY_CARE_PROVIDER_SITE_OTHER): Payer: PRIVATE HEALTH INSURANCE | Admitting: Obstetrics & Gynecology

## 2016-10-08 VITALS — BP 106/78 | HR 88 | Wt 212.0 lb

## 2016-10-08 DIAGNOSIS — Z23 Encounter for immunization: Secondary | ICD-10-CM | POA: Diagnosis not present

## 2016-10-08 DIAGNOSIS — Z3401 Encounter for supervision of normal first pregnancy, first trimester: Secondary | ICD-10-CM

## 2016-10-08 DIAGNOSIS — O09513 Supervision of elderly primigravida, third trimester: Secondary | ICD-10-CM

## 2016-10-08 LAB — CBC
HCT: 35.9 % (ref 35.0–45.0)
Hemoglobin: 11.8 g/dL (ref 11.7–15.5)
MCH: 32.6 pg (ref 27.0–33.0)
MCHC: 32.9 g/dL (ref 32.0–36.0)
MCV: 99.2 fL (ref 80.0–100.0)
MPV: 9.6 fL (ref 7.5–12.5)
PLATELETS: 237 10*3/uL (ref 140–400)
RBC: 3.62 MIL/uL — ABNORMAL LOW (ref 3.80–5.10)
RDW: 12.7 % (ref 11.0–15.0)
WBC: 9.1 10*3/uL (ref 3.8–10.8)

## 2016-10-08 NOTE — Progress Notes (Signed)
   PRENATAL VISIT NOTE  Subjective:  Kimberly [name] is a [age] y.o. G1P0000 at 814w0d being seen today for ongoing prenatal care.  She is currently monitored for the following issues for this high-risk pregnancy and has Encounter for supervision of normal pregnancy in first trimester and Elderly primigravida in first trimester on her problem list.  Patient reports mild low back pain, improves with stretching..  Contractions: Not present. Vag. Bleeding: None.  Movement: Present. Denies leaking of fluid.   The following portions of the patient's history were reviewed and updated as appropriate: allergies, current medications, past family history, past medical history, past social history, past surgical history and problem list. Problem list updated.  Objective:   Vitals:   10/08/16 0834 10/08/16 0851  BP: 106/73 106/78  Pulse: 71 88  Weight: 213 lb (96.6 kg) 212 lb (96.2 kg)    Fetal Status: Fetal Heart Rate (bpm): 144 Fundal Height: 29 cm Movement: Present     General:  Alert, oriented and cooperative. Patient is in no acute distress.  Skin: Skin is warm and dry. No rash noted.   Cardiovascular: Normal heart rate noted  Respiratory: Normal respiratory effort, no problems with respiration noted  Abdomen: Soft, gravid, appropriate for gestational age. Pain/Pressure: Present     Pelvic:  Cervical exam deferred        Extremities: Normal range of motion.  Edema: Trace  Mental Status: Normal mood and affect. Normal behavior. Normal judgment and thought content.   Assessment and Plan:  Pregnancy: G1P0000 at 644w0d  1. Advanced maternal age, 1st pregnancy, third trimester - 2Hr GTT w/ 1 Hr Carpenter 75 g - CBC - HIV antibody (with reflex) - RPR - Tdap vaccine greater than or equal to 7yo IM -US for growth (AMA age 40)  2. Encounter for supervision of normal first pregnancy in first trimester Reviewed weight gain and lower back pain--yoga, swimming, prenatal massage.  Preterm labor  symptoms and general obstetric precautions including but not limited to vaginal bleeding, contractions, leaking of fluid and fetal movement were reviewed in detail with the patient. Please refer to After Visit Summary for other counseling recommendations.  No Follow-up on file.   Elsie LincolnKelly Graceann Boileau, MD

## 2016-10-09 LAB — 2HR GTT W 1 HR, CARPENTER, 75 G
GLUCOSE, 1 HR, GEST: 154 mg/dL (ref <180)
GLUCOSE, 2 HR, GEST: 115 mg/dL (ref <153)
Glucose, Fasting, Gest: 71 mg/dL (ref 65–91)

## 2016-10-09 LAB — RPR

## 2016-10-09 LAB — HIV ANTIBODY (ROUTINE TESTING W REFLEX): HIV 1&2 Ab, 4th Generation: NONREACTIVE

## 2016-10-10 ENCOUNTER — Telehealth: Payer: Self-pay

## 2016-10-10 NOTE — Telephone Encounter (Signed)
Spoke with pt and she is aware that she passed her 2 hour GTT 

## 2016-10-17 ENCOUNTER — Encounter (HOSPITAL_COMMUNITY): Payer: Self-pay

## 2016-10-17 ENCOUNTER — Other Ambulatory Visit (HOSPITAL_COMMUNITY): Payer: Self-pay | Admitting: Maternal and Fetal Medicine

## 2016-10-17 ENCOUNTER — Ambulatory Visit (HOSPITAL_COMMUNITY)
Admission: RE | Admit: 2016-10-17 | Discharge: 2016-10-17 | Disposition: A | Discharge status: Home / Self Care | Payer: PRIVATE HEALTH INSURANCE | Source: Ambulatory Visit | Attending: Advanced Practice Midwife | Admitting: Advanced Practice Midwife

## 2016-10-17 DIAGNOSIS — O321XX0 Maternal care for breech presentation, not applicable or unspecified: Secondary | ICD-10-CM | POA: Diagnosis not present

## 2016-10-17 DIAGNOSIS — O09512 Supervision of elderly primigravida, second trimester: Secondary | ICD-10-CM

## 2016-10-17 DIAGNOSIS — Z6833 Body mass index (BMI) 33.0-33.9, adult: Secondary | ICD-10-CM | POA: Diagnosis not present

## 2016-10-17 DIAGNOSIS — O99213 Obesity complicating pregnancy, third trimester: Secondary | ICD-10-CM | POA: Insufficient documentation

## 2016-10-17 DIAGNOSIS — Z3A29 29 weeks gestation of pregnancy: Secondary | ICD-10-CM | POA: Diagnosis not present

## 2016-10-17 DIAGNOSIS — O09523 Supervision of elderly multigravida, third trimester: Secondary | ICD-10-CM | POA: Diagnosis not present

## 2016-10-17 DIAGNOSIS — E669 Obesity, unspecified: Secondary | ICD-10-CM | POA: Insufficient documentation

## 2016-10-22 ENCOUNTER — Ambulatory Visit (INDEPENDENT_AMBULATORY_CARE_PROVIDER_SITE_OTHER): Payer: PRIVATE HEALTH INSURANCE | Admitting: Obstetrics & Gynecology

## 2016-10-22 DIAGNOSIS — O09513 Supervision of elderly primigravida, third trimester: Secondary | ICD-10-CM

## 2016-10-22 DIAGNOSIS — Z3403 Encounter for supervision of normal first pregnancy, third trimester: Secondary | ICD-10-CM

## 2016-10-22 NOTE — Patient Instructions (Signed)
Third Trimester of Pregnancy The third trimester is from week 28 through week 40 (months 7 through 9). The third trimester is a time when the unborn baby (fetus) is growing rapidly. At the end of the ninth month, the fetus is about 20 inches in length and weighs 6-10 pounds. Body changes during your third trimester Your body will continue to go through many changes during pregnancy. The changes vary from woman to woman. During the third trimester:  Your weight will continue to increase. You can expect to gain 25-35 pounds (11-16 kg) by the end of the pregnancy.  You may begin to get stretch marks on your hips, abdomen, and breasts.  You may urinate more often because the fetus is moving lower into your pelvis and pressing on your bladder.  You may develop or continue to have heartburn. This is caused by increased hormones that slow down muscles in the digestive tract.  You may develop or continue to have constipation because increased hormones slow digestion and cause the muscles that push waste through your intestines to relax.  You may develop hemorrhoids. These are swollen veins (varicose veins) in the rectum that can itch or be painful.  You may develop swollen, bulging veins (varicose veins) in your legs.  You may have increased body aches in the pelvis, back, or thighs. This is due to weight gain and increased hormones that are relaxing your joints.  You may have changes in your hair. These can include thickening of your hair, rapid growth, and changes in texture. Some women also have hair loss during or after pregnancy, or hair that feels dry or thin. Your hair will most likely return to normal after your baby is born.  Your breasts will continue to grow and they will continue to become tender. A yellow fluid (colostrum) may leak from your breasts. This is the first milk you are producing for your baby.  Your belly button may stick out.  You may notice more swelling in your hands,  face, or ankles.  You may have increased tingling or numbness in your hands, arms, and legs. The skin on your belly may also feel numb.  You may feel short of breath because of your expanding uterus.  You may have more problems sleeping. This can be caused by the size of your belly, increased need to urinate, and an increase in your body's metabolism.  You may notice the fetus "dropping," or moving lower in your abdomen (lightening).  You may have increased vaginal discharge.  You may notice your joints feel loose and you may have pain around your pelvic bone.  What to expect at prenatal visits You will have prenatal exams every 2 weeks until week 36. Then you will have weekly prenatal exams. During a routine prenatal visit:  You will be weighed to make sure you and the baby are growing normally.  Your blood pressure will be taken.  Your abdomen will be measured to track your baby's growth.  The fetal heartbeat will be listened to.  Any test results from the previous visit will be discussed.  You may have a cervical check near your due date to see if your cervix has softened or thinned (effaced).  You will be tested for Group B streptococcus. This happens between 35 and 37 weeks.  Your health care provider may ask you:  What your birth plan is.  How you are feeling.  If you are feeling the baby move.  If you have had   any abnormal symptoms, such as leaking fluid, bleeding, severe headaches, or abdominal cramping.  If you are using any tobacco products, including cigarettes, chewing tobacco, and electronic cigarettes.  If you have any questions.  Other tests or screenings that may be performed during your third trimester include:  Blood tests that check for low iron levels (anemia).  Fetal testing to check the health, activity level, and growth of the fetus. Testing is done if you have certain medical conditions or if there are problems during the  pregnancy.  Nonstress test (NST). This test checks the health of your baby to make sure there are no signs of problems, such as the baby not getting enough oxygen. During this test, a belt is placed around your belly. The baby is made to move, and its heart rate is monitored during movement.  What is false labor? False labor is a condition in which you feel small, irregular tightenings of the muscles in the womb (contractions) that usually go away with rest, changing position, or drinking water. These are called Braxton Hicks contractions. Contractions may last for hours, days, or even weeks before true labor sets in. If contractions come at regular intervals, become more frequent, increase in intensity, or become painful, you should see your health care provider. What are the signs of labor?  Abdominal cramps.  Regular contractions that start at 10 minutes apart and become stronger and more frequent with time.  Contractions that start on the top of the uterus and spread down to the lower abdomen and back.  Increased pelvic pressure and dull back pain.  A watery or bloody mucus discharge that comes from the vagina.  Leaking of amniotic fluid. This is also known as your "water breaking." It could be a slow trickle or a gush. Let your health care provider know if it has a color or strange odor. If you have any of these signs, call your health care provider right away, even if it is before your due date. Follow these instructions at home: Medicines  Follow your health care provider's instructions regarding medicine use. Specific medicines may be either safe or unsafe to take during pregnancy.  Take a prenatal vitamin that contains at least 600 micrograms (mcg) of folic acid.  If you develop constipation, try taking a stool softener if your health care provider approves. Eating and drinking  Eat a balanced diet that includes fresh fruits and vegetables, whole grains, good sources of protein  such as meat, eggs, or tofu, and low-fat dairy. Your health care provider will help you determine the amount of weight gain that is right for you.  Avoid raw meat and uncooked cheese. These carry germs that can cause birth defects in the baby.  If you have low calcium intake from food, talk to your health care provider about whether you should take a daily calcium supplement.  Eat four or five small meals rather than three large meals a day.  Limit foods that are high in fat and processed sugars, such as fried and sweet foods.  To prevent constipation: ? Drink enough fluid to keep your urine clear or pale yellow. ? Eat foods that are high in fiber, such as fresh fruits and vegetables, whole grains, and beans. Activity  Exercise only as directed by your health care provider. Most women can continue their usual exercise routine during pregnancy. Try to exercise for 30 minutes at least 5 days a week. Stop exercising if you experience uterine contractions.  Avoid heavy   lifting.  Do not exercise in extreme heat or humidity, or at high altitudes.  Wear low-heel, comfortable shoes.  Practice good posture.  You may continue to have sex unless your health care provider tells you otherwise. Relieving pain and discomfort  Take frequent breaks and rest with your legs elevated if you have leg cramps or low back pain.  Take warm sitz baths to soothe any pain or discomfort caused by hemorrhoids. Use hemorrhoid cream if your health care provider approves.  Wear a good support bra to prevent discomfort from breast tenderness.  If you develop varicose veins: ? Wear support pantyhose or compression stockings as told by your healthcare provider. ? Elevate your feet for 15 minutes, 3-4 times a day. Prenatal care  Write down your questions. Take them to your prenatal visits.  Keep all your prenatal visits as told by your health care provider. This is important. Safety  Wear your seat belt at  all times when driving.  Make a list of emergency phone numbers, including numbers for family, friends, the hospital, and police and fire departments. General instructions  Avoid cat litter boxes and soil used by cats. These carry germs that can cause birth defects in the baby. If you have a cat, ask someone to clean the litter box for you.  Do not travel far distances unless it is absolutely necessary and only with the approval of your health care provider.  Do not use hot tubs, steam rooms, or saunas.  Do not drink alcohol.  Do not use any products that contain nicotine or tobacco, such as cigarettes and e-cigarettes. If you need help quitting, ask your health care provider.  Do not use any medicinal herbs or unprescribed drugs. These chemicals affect the formation and growth of the baby.  Do not douche or use tampons or scented sanitary pads.  Do not cross your legs for long periods of time.  To prepare for the arrival of your baby: ? Take prenatal classes to understand, practice, and ask questions about labor and delivery. ? Make a trial run to the hospital. ? Visit the hospital and tour the maternity area. ? Arrange for maternity or paternity leave through employers. ? Arrange for family and friends to take care of pets while you are in the hospital. ? Purchase a rear-facing car seat and make sure you know how to install it in your car. ? Pack your hospital bag. ? Prepare the baby's nursery. Make sure to remove all pillows and stuffed animals from the baby's crib to prevent suffocation.  Visit your dentist if you have not gone during your pregnancy. Use a soft toothbrush to brush your teeth and be gentle when you floss. Contact a health care provider if:  You are unsure if you are in labor or if your water has broken.  You become dizzy.  You have mild pelvic cramps, pelvic pressure, or nagging pain in your abdominal area.  You have lower back pain.  You have persistent  nausea, vomiting, or diarrhea.  You have an unusual or bad smelling vaginal discharge.  You have pain when you urinate. Get help right away if:  Your water breaks before 37 weeks.  You have regular contractions less than 5 minutes apart before 37 weeks.  You have a fever.  You are leaking fluid from your vagina.  You have spotting or bleeding from your vagina.  You have severe abdominal pain or cramping.  You have rapid weight loss or weight gain.    You have shortness of breath with chest pain.  You notice sudden or extreme swelling of your face, hands, ankles, feet, or legs.  Your baby makes fewer than 10 movements in 2 hours.  You have severe headaches that do not go away when you take medicine.  You have vision changes. Summary  The third trimester is from week 28 through week 40, months 7 through 9. The third trimester is a time when the unborn baby (fetus) is growing rapidly.  During the third trimester, your discomfort may increase as you and your baby continue to gain weight. You may have abdominal, leg, and back pain, sleeping problems, and an increased need to urinate.  During the third trimester your breasts will keep growing and they will continue to become tender. A yellow fluid (colostrum) may leak from your breasts. This is the first milk you are producing for your baby.  False labor is a condition in which you feel small, irregular tightenings of the muscles in the womb (contractions) that eventually go away. These are called Braxton Hicks contractions. Contractions may last for hours, days, or even weeks before true labor sets in.  Signs of labor can include: abdominal cramps; regular contractions that start at 10 minutes apart and become stronger and more frequent with time; watery or bloody mucus discharge that comes from the vagina; increased pelvic pressure and dull back pain; and leaking of amniotic fluid. This information is not intended to replace advice  given to you by your health care provider. Make sure you discuss any questions you have with your health care provider. Document Released: 03/25/2001 Document Revised: 09/06/2015 Document Reviewed: 06/01/2012 [employer] Interactive Patient Education  2017 [employer]  

## 2016-10-22 NOTE — Progress Notes (Signed)
PRENATAL VISIT NOTE  Subjective:  Kimberly [name] is a 41 y.o. G1P0000 at [redacted]w[redacted]d being seen today for ongoing prenatal care.  She is currently monitored for the following issues for this low-risk pregnancy and has Encounter for supervision of normal first pregnancy in third trimester and Advanced maternal age, primigravida in third trimester, antepartum on her problem list.  Patient reports no complaints.  Contractions: Not present. Vag. Bleeding: None.  Movement: Present. Denies leaking of fluid.   The following portions of the patient's history were reviewed and updated as appropriate: allergies, current medications, past family history, past medical history, past social history, past surgical history and problem list. Problem list updated.  Objective:   Vitals:   10/22/16 0845  BP: 116/77  Pulse: 78  Weight: 217 lb (98.4 kg)    Fetal Status: Fetal Heart Rate (bpm): 145 Fundal Height: 30 cm Movement: Present     General:  Alert, oriented and cooperative. Patient is in no acute distress.  Skin: Skin is warm and dry. No rash noted.   Cardiovascular: Normal heart rate noted  Respiratory: Normal respiratory effort, no problems with respiration noted  Abdomen: Soft, gravid, appropriate for gestational age. Pain/Pressure: Absent     Pelvic:  Cervical exam deferred        Extremities: Normal range of motion.  Edema: Trace  Mental Status: Normal mood and affect. Normal behavior. Normal judgment and thought content.   Results for orders placed or performed in visit on 10/08/16 (from the past 504 hour(s))  2Hr GTT w/ 1 Hr Carpenter 75 g   Collection Time: 10/08/16  9:44 AM  Result Value Ref Range   Glucose, Fasting, Gest 71 65 - 91 mg/dL   Glucose, 1 Hr, Gest 130 <180 mg/dL   Glucose, 2 Hr, Gest 865 <153 mg/dL   Comment SEE NOTE   CBC   Collection Time: 10/08/16  9:44 AM  Result Value Ref Range   WBC 9.1 3.8 - 10.8 K/uL   RBC 3.62 (L) 3.80 - 5.10 MIL/uL   Hemoglobin 11.8 11.7 -  15.5 g/dL   HCT 78.4 69.6 - 29.5 %   MCV 99.2 80.0 - 100.0 fL   MCH 32.6 27.0 - 33.0 pg   MCHC 32.9 32.0 - 36.0 g/dL   RDW 28.4 13.2 - 44.0 %   Platelets 237 140 - 400 K/uL   MPV 9.6 7.5 - 12.5 fL  HIV antibody (with reflex)   Collection Time: 10/08/16  9:44 AM  Result Value Ref Range   HIV 1&2 Ab, 4th Generation NONREACTIVE NONREACTIVE  RPR   Collection Time: 10/08/16  9:44 AM  Result Value Ref Range   RPR Ser Ql NON REAC NON REAC     [place] Mfm Ob Follow Up  Result Date: 10/17/2016 ----------------------------------------------------------------------  OBSTETRICS REPORT                      (Signed Final 10/17/2016 09:15 am) ---------------------------------------------------------------------- Patient Info  ID #:       [id number]                         D.O.B.:   [birth year] (40 yrs)  Name:       Kimberly [name]                 Visit Date:  10/17/2016 08:01 am ---------------------------------------------------------------------- Performed By  Performed By:     Hurman Horn  Ref. Address:     [address]                    [facility]                                                             [address]                                                             [place]                                                             [phone]  Attending:        Ledon SnareBrian Brost MD         [facility]  Referred By:      Dorathy KinsmanVIRGINIA SMITH                    CNM ---------------------------------------------------------------------- Orders   #  Description                                 Code   1  US MFM OB FOLLOW UP                         [id number]  ----------------------------------------------------------------------   #  Ordered By               Order #        Accession #    Episode #   1  Alpha GulaPAUL WHITECAR            [id number]      [phone]     [id number]  ---------------------------------------------------------------------- Indications   [redacted] weeks gestation of pregnancy                 Z3A.29   Obesity complicating pregnancy, third          O99.213   trimester   Advanced maternal age multigravida 5835+,        54O09.523   third trimester (neg NIPS)  ---------------------------------------------------------------------- OB History  Blood Type:            Height:  5'8"   Weight (lb):  219      BMI:   33.3  Gravidity:    1         Term:   0        Prem:   0        SAB:   0  TOP:          0       Ectopic:  0 ---------------------------------------------------------------------- Fetal Evaluation  Num Of Fetuses:     1  Fetal Heart  130  Rate(bpm):  Cardiac Activity:   Observed  Presentation:       Breech  Placenta:           Fundal, above cervical os  P. Cord Insertion:  Visualized, central  Amniotic Fluid  AFI FV:      Subjectively within normal limits  AFI Sum(cm)     %Tile       Largest Pocket(cm)  12.47           33          3.64  RUQ(cm)       RLQ(cm)       LUQ(cm)        LLQ(cm)  2.96          3.64          2.91           2.96 ---------------------------------------------------------------------- Biometry  BPD:      80.7  mm     G. Age:  32w 3d         99  %    CI:        78.84   %   70 - 86                                                          FL/HC:      19.9   %   19.6 - 20.8  HC:      287.4  mm     G. Age:  31w 4d         82  %    HC/AC:      1.10       0.99 - 1.21  AC:      260.7  mm     G. Age:  30w 1d         72  %    FL/BPD:     71.0   %   71 - 87  FL:       57.3  mm     G. Age:  30w 0d         57  %    FL/AC:      22.0   %   20 - 24  Est. FW:    1576  gm      3 lb 8 oz     74  % ---------------------------------------------------------------------- Gestational Age  LMP:           29w 2d       Date:   03/26/16                 EDD:   12/31/16  U/S Today:     31w 0d                                        EDD:   12/19/16  Best:          29w 2d    Det. By:   LMP  (03/26/16)          EDD:   12/31/16 ---------------------------------------------------------------------- Anatomy  Cranium:  Appears normal         Aortic Arch:            Previously seen  Cavum:                 Previously seen        Ductal Arch:            Previously seen  Ventricles:            Appears normal         Diaphragm:              Previously seen  Choroid Plexus:        Previously seen        Stomach:                Appears normal, left                                                                        sided  Cerebellum:            Previously seen        Abdomen:                Appears normal  Posterior Fossa:       Previously seen        Abdominal Wall:         Previously seen  Nuchal Fold:           Previously seen        Cord Vessels:           Previously seen  Face:                  Orbits and profile     Kidneys:                Appear normal                         previously seen  Lips:                  Previously seen        Bladder:                Appears normal  Thoracic:              Appears normal         Spine:                  Previously seen  Heart:                 Previously seen        Upper Extremities:      Previously seen  RVOT:                  Previously seen        Lower Extremities:      Previously seen  LVOT:                  Previously seen  Other:  Female gender previously seen. Heels and 5th digit previously seen.          Technically  difficult due to fetal position. ---------------------------------------------------------------------- Cervix Uterus Adnexa  Cervix  Length:           4.18  cm.  Normal appearance by transabdominal scan.  Uterus  No abnormality visualized. ---------------------------------------------------------------------- Impression  Singleton intrauterine pregnancy at 29 weeks 2 days  gestation with fetal cardiac activity  Breech presentation  Fundal placenta  Normal appearing fetal growth and amniotic  fluid volume  Normal appearing cervical length ---------------------------------------------------------------------- Recommendations  Consider a repeat  ultrasound in 6 weeks for evaluation of  fetal growth  Consider antenatal testing at 38 weeks if undelivered ----------------------------------------------------------------------                   Ledon Snare, MD Electronically Signed Final Report   10/17/2016 09:15 am ----------------------------------------------------------------------   Assessment and Plan:  Pregnancy: G1P0000 at [redacted]w[redacted]d  1. Advanced maternal age, primigravida in third trimester, antepartum Normal growth scan in third trimester  2. Encounter for supervision of normal first pregnancy in third trimester Normal third trimester labs, reviewed with patient. Preterm labor symptoms and general obstetric precautions including but not limited to vaginal bleeding, contractions, leaking of fluid and fetal movement were reviewed in detail with the patient. Please refer to After Visit Summary for other counseling recommendations.  Return in about 2 weeks (around 11/05/2016) for OB Visit.   Jaynie Collins, MD

## 2016-11-05 ENCOUNTER — Ambulatory Visit (INDEPENDENT_AMBULATORY_CARE_PROVIDER_SITE_OTHER): Payer: PRIVATE HEALTH INSURANCE | Admitting: Obstetrics & Gynecology

## 2016-11-05 VITALS — BP 114/78 | HR 85 | Wt 222.0 lb

## 2016-11-05 DIAGNOSIS — O09513 Supervision of elderly primigravida, third trimester: Secondary | ICD-10-CM

## 2016-11-05 DIAGNOSIS — Z3403 Encounter for supervision of normal first pregnancy, third trimester: Secondary | ICD-10-CM

## 2016-11-05 DIAGNOSIS — R635 Abnormal weight gain: Secondary | ICD-10-CM

## 2016-11-05 LAB — TSH: TSH: 1.31 mIU/L

## 2016-11-05 NOTE — Progress Notes (Signed)
   PRENATAL VISIT NOTE  Subjective:  Kimberly [name] is a 41 y.o. MW G1P0000 at 2124w0d being seen today for ongoing prenatal care.  She is currently monitored for the following issues for this high-risk pregnancy and has Encounter for supervision of normal first pregnancy in third trimester and Advanced maternal age, primigravida in third trimester, antepartum on her problem list.  Patient reports no complaints.  Contractions: Not present. Vag. Bleeding: None.  Movement: Present. Denies leaking of fluid.   The following portions of the patient's history were reviewed and updated as appropriate: allergies, current medications, past family history, past medical history, past social history, past surgical history and problem list. Problem list updated.  Objective:   Vitals:   11/05/16 0844  BP: 114/78  Pulse: 85  Weight: 222 lb (100.7 kg)    Fetal Status: Fetal Heart Rate (bpm): 137 Fundal Height: 32 cm Movement: Present     General:  Alert, oriented and cooperative. Patient is in no acute distress.  Skin: Skin is warm and dry. No rash noted.   Cardiovascular: Normal heart rate noted  Respiratory: Normal respiratory effort, no problems with respiration noted  Abdomen: Soft, gravid, appropriate for gestational age.  Pain/Pressure: Present     Pelvic: Cervical exam deferred        Extremities: Normal range of motion.  Edema: Trace  Mental Status:  Normal mood and affect. Normal behavior. Normal judgment and thought content.   Assessment and Plan:  Pregnancy: G1P0000 at 2624w0d  1. Weight gain  - TSH  2. Advanced maternal age, primigravida in third trimester, antepartum - start twice weekly testing at 36 weeks  3. Encounter for supervision of normal first pregnancy in third trimester   Preterm labor symptoms and general obstetric precautions including but not limited to vaginal bleeding, contractions, leaking of fluid and fetal movement were reviewed in detail with the  patient. Please refer to After Visit Summary for other counseling recommendations.  Return in about 1 week (around 11/12/2016).   Allie BossierMyra C Shondra Capps, MD

## 2016-11-18 ENCOUNTER — Ambulatory Visit (INDEPENDENT_AMBULATORY_CARE_PROVIDER_SITE_OTHER): Payer: PRIVATE HEALTH INSURANCE | Admitting: Family Medicine

## 2016-11-18 VITALS — BP 130/78 | HR 88 | Wt 222.0 lb

## 2016-11-18 DIAGNOSIS — Z3403 Encounter for supervision of normal first pregnancy, third trimester: Secondary | ICD-10-CM

## 2016-11-18 NOTE — Patient Instructions (Signed)
 Third Trimester of Pregnancy The third trimester is from week 28 through week 40 (months 7 through 9). The third trimester is a time when the unborn baby (fetus) is growing rapidly. At the end of the ninth month, the fetus is about 20 inches in length and weighs 6-10 pounds. Body changes during your third trimester Your body will continue to go through many changes during pregnancy. The changes vary from woman to woman. During the third trimester:  Your weight will continue to increase. You can expect to gain 25-35 pounds (11-16 kg) by the end of the pregnancy.  You may begin to get stretch marks on your hips, abdomen, and breasts.  You may urinate more often because the fetus is moving lower into your pelvis and pressing on your bladder.  You may develop or continue to have heartburn. This is caused by increased hormones that slow down muscles in the digestive tract.  You may develop or continue to have constipation because increased hormones slow digestion and cause the muscles that push waste through your intestines to relax.  You may develop hemorrhoids. These are swollen veins (varicose veins) in the rectum that can itch or be painful.  You may develop swollen, bulging veins (varicose veins) in your legs.  You may have increased body aches in the pelvis, back, or thighs. This is due to weight gain and increased hormones that are relaxing your joints.  You may have changes in your hair. These can include thickening of your hair, rapid growth, and changes in texture. Some women also have hair loss during or after pregnancy, or hair that feels dry or thin. Your hair will most likely return to normal after your baby is born.  Your breasts will continue to grow and they will continue to become tender. A yellow fluid (colostrum) may leak from your breasts. This is the first milk you are producing for your baby.  Your belly button may stick out.  You may notice more swelling in your  hands, face, or ankles.  You may have increased tingling or numbness in your hands, arms, and legs. The skin on your belly may also feel numb.  You may feel short of breath because of your expanding uterus.  You may have more problems sleeping. This can be caused by the size of your belly, increased need to urinate, and an increase in your body's metabolism.  You may notice the fetus "dropping," or moving lower in your abdomen (lightening).  You may have increased vaginal discharge.  You may notice your joints feel loose and you may have pain around your pelvic bone.  What to expect at prenatal visits You will have prenatal exams every 2 weeks until week 36. Then you will have weekly prenatal exams. During a routine prenatal visit:  You will be weighed to make sure you and the baby are growing normally.  Your blood pressure will be taken.  Your abdomen will be measured to track your baby's growth.  The fetal heartbeat will be listened to.  Any test results from the previous visit will be discussed.  You may have a cervical check near your due date to see if your cervix has softened or thinned (effaced).  You will be tested for Group B streptococcus. This happens between 35 and 37 weeks.  Your health care provider may ask you:  What your birth plan is.  How you are feeling.  If you are feeling the baby move.  If you have   had any abnormal symptoms, such as leaking fluid, bleeding, severe headaches, or abdominal cramping.  If you are using any tobacco products, including cigarettes, chewing tobacco, and electronic cigarettes.  If you have any questions.  Other tests or screenings that may be performed during your third trimester include:  Blood tests that check for low iron levels (anemia).  Fetal testing to check the health, activity level, and growth of the fetus. Testing is done if you have certain medical conditions or if there are problems during the  pregnancy.  Nonstress test (NST). This test checks the health of your baby to make sure there are no signs of problems, such as the baby not getting enough oxygen. During this test, a belt is placed around your belly. The baby is made to move, and its heart rate is monitored during movement.  What is false labor? False labor is a condition in which you feel small, irregular tightenings of the muscles in the womb (contractions) that usually go away with rest, changing position, or drinking water. These are called Braxton Hicks contractions. Contractions may last for hours, days, or even weeks before true labor sets in. If contractions come at regular intervals, become more frequent, increase in intensity, or become painful, you should see your health care provider. What are the signs of labor?  Abdominal cramps.  Regular contractions that start at 10 minutes apart and become stronger and more frequent with time.  Contractions that start on the top of the uterus and spread down to the lower abdomen and back.  Increased pelvic pressure and dull back pain.  A watery or bloody mucus discharge that comes from the vagina.  Leaking of amniotic fluid. This is also known as your "water breaking." It could be a slow trickle or a gush. Let your health care provider know if it has a color or strange odor. If you have any of these signs, call your health care provider right away, even if it is before your due date. Follow these instructions at home: Medicines  Follow your health care provider's instructions regarding medicine use. Specific medicines may be either safe or unsafe to take during pregnancy.  Take a prenatal vitamin that contains at least 600 micrograms (mcg) of folic acid.  If you develop constipation, try taking a stool softener if your health care provider approves. Eating and drinking  Eat a balanced diet that includes fresh fruits and vegetables, whole grains, good sources of protein  such as meat, eggs, or tofu, and low-fat dairy. Your health care provider will help you determine the amount of weight gain that is right for you.  Avoid raw meat and uncooked cheese. These carry germs that can cause birth defects in the baby.  If you have low calcium intake from food, talk to your health care provider about whether you should take a daily calcium supplement.  Eat four or five small meals rather than three large meals a day.  Limit foods that are high in fat and processed sugars, such as fried and sweet foods.  To prevent constipation: ? Drink enough fluid to keep your urine clear or pale yellow. ? Eat foods that are high in fiber, such as fresh fruits and vegetables, whole grains, and beans. Activity  Exercise only as directed by your health care provider. Most women can continue their usual exercise routine during pregnancy. Try to exercise for 30 minutes at least 5 days a week. Stop exercising if you experience uterine contractions.  Avoid   heavy lifting.  Do not exercise in extreme heat or humidity, or at high altitudes.  Wear low-heel, comfortable shoes.  Practice good posture.  You may continue to have sex unless your health care provider tells you otherwise. Relieving pain and discomfort  Take frequent breaks and rest with your legs elevated if you have leg cramps or low back pain.  Take warm sitz baths to soothe any pain or discomfort caused by hemorrhoids. Use hemorrhoid cream if your health care provider approves.  Wear a good support bra to prevent discomfort from breast tenderness.  If you develop varicose veins: ? Wear support pantyhose or compression stockings as told by your healthcare provider. ? Elevate your feet for 15 minutes, 3-4 times a day. Prenatal care  Write down your questions. Take them to your prenatal visits.  Keep all your prenatal visits as told by your health care provider. This is important. Safety  Wear your seat belt at  all times when driving.  Make a list of emergency phone numbers, including numbers for family, friends, the hospital, and police and fire departments. General instructions  Avoid cat litter boxes and soil used by cats. These carry germs that can cause birth defects in the baby. If you have a cat, ask someone to clean the litter box for you.  Do not travel far distances unless it is absolutely necessary and only with the approval of your health care provider.  Do not use hot tubs, steam rooms, or saunas.  Do not drink alcohol.  Do not use any products that contain nicotine or tobacco, such as cigarettes and e-cigarettes. If you need help quitting, ask your health care provider.  Do not use any medicinal herbs or unprescribed drugs. These chemicals affect the formation and growth of the baby.  Do not douche or use tampons or scented sanitary pads.  Do not cross your legs for long periods of time.  To prepare for the arrival of your baby: ? Take prenatal classes to understand, practice, and ask questions about labor and delivery. ? Make a trial run to the hospital. ? Visit the hospital and tour the maternity area. ? Arrange for maternity or paternity leave through employers. ? Arrange for family and friends to take care of pets while you are in the hospital. ? Purchase a rear-facing car seat and make sure you know how to install it in your car. ? Pack your hospital bag. ? Prepare the baby's nursery. Make sure to remove all pillows and stuffed animals from the baby's crib to prevent suffocation.  Visit your dentist if you have not gone during your pregnancy. Use a soft toothbrush to brush your teeth and be gentle when you floss. Contact a health care provider if:  You are unsure if you are in labor or if your water has broken.  You become dizzy.  You have mild pelvic cramps, pelvic pressure, or nagging pain in your abdominal area.  You have lower back pain.  You have persistent  nausea, vomiting, or diarrhea.  You have an unusual or bad smelling vaginal discharge.  You have pain when you urinate. Get help right away if:  Your water breaks before 37 weeks.  You have regular contractions less than 5 minutes apart before 37 weeks.  You have a fever.  You are leaking fluid from your vagina.  You have spotting or bleeding from your vagina.  You have severe abdominal pain or cramping.  You have rapid weight loss or weight   gain.  You have shortness of breath with chest pain.  You notice sudden or extreme swelling of your face, hands, ankles, feet, or legs.  Your baby makes fewer than 10 movements in 2 hours.  You have severe headaches that do not go away when you take medicine.  You have vision changes. Summary  The third trimester is from week 28 through week 40, months 7 through 9. The third trimester is a time when the unborn baby (fetus) is growing rapidly.  During the third trimester, your discomfort may increase as you and your baby continue to gain weight. You may have abdominal, leg, and back pain, sleeping problems, and an increased need to urinate.  During the third trimester your breasts will keep growing and they will continue to become tender. A yellow fluid (colostrum) may leak from your breasts. This is the first milk you are producing for your baby.  False labor is a condition in which you feel small, irregular tightenings of the muscles in the womb (contractions) that eventually go away. These are called Braxton Hicks contractions. Contractions may last for hours, days, or even weeks before true labor sets in.  Signs of labor can include: abdominal cramps; regular contractions that start at 10 minutes apart and become stronger and more frequent with time; watery or bloody mucus discharge that comes from the vagina; increased pelvic pressure and dull back pain; and leaking of amniotic fluid. This information is not intended to replace advice  given to you by your health care provider. Make sure you discuss any questions you have with your health care provider. Document Released: 03/25/2001 Document Revised: 09/06/2015 Document Reviewed: 06/01/2012 [employer] Interactive Patient Education  2017 [employer]   Breastfeeding Deciding to breastfeed is one of the best choices you can make for you and your baby. A change in hormones during pregnancy causes your breast tissue to grow and increases the number and size of your milk ducts. These hormones also allow proteins, sugars, and fats from your blood supply to make breast milk in your milk-producing glands. Hormones prevent breast milk from being released before your baby is born as well as prompt milk flow after birth. Once breastfeeding has begun, thoughts of your baby, as well as his or her sucking or crying, can stimulate the release of milk from your milk-producing glands. Benefits of breastfeeding For Your Baby  Your first milk (colostrum) helps your baby's digestive system function better.  There are antibodies in your milk that help your baby fight off infections.  Your baby has a lower incidence of asthma, allergies, and sudden infant death syndrome.  The nutrients in breast milk are better for your baby than infant formulas and are designed uniquely for your baby's needs.  Breast milk improves your baby's brain development.  Your baby is less likely to develop other conditions, such as childhood obesity, asthma, or type 2 diabetes mellitus.  For You  Breastfeeding helps to create a very special bond between you and your baby.  Breastfeeding is convenient. Breast milk is always available at the correct temperature and costs nothing.  Breastfeeding helps to burn calories and helps you lose the weight gained during pregnancy.  Breastfeeding makes your uterus contract to its prepregnancy size faster and slows bleeding (lochia) after you give birth.  Breastfeeding helps  to lower your risk of developing type 2 diabetes mellitus, osteoporosis, and breast or ovarian cancer later in life.  Signs that your baby is hungry Early Signs of Hunger    Increased alertness or activity.  Stretching.  Movement of the head from side to side.  Movement of the head and opening of the mouth when the corner of the mouth or cheek is stroked (rooting).  Increased sucking sounds, smacking lips, cooing, sighing, or squeaking.  Hand-to-mouth movements.  Increased sucking of fingers or hands.  Late Signs of Hunger  Fussing.  Intermittent crying.  Extreme Signs of Hunger Signs of extreme hunger will require calming and consoling before your baby will be able to breastfeed successfully. Do not wait for the following signs of extreme hunger to occur before you initiate breastfeeding:  Restlessness.  A loud, strong cry.  Screaming.  Breastfeeding basics Breastfeeding Initiation  Find a comfortable place to sit or lie down, with your neck and back well supported.  Place a pillow or rolled up blanket under your baby to bring him or her to the level of your breast (if you are seated). Nursing pillows are specially designed to help support your arms and your baby while you breastfeed.  Make sure that your baby's abdomen is facing your abdomen.  Gently massage your breast. With your fingertips, massage from your chest wall toward your nipple in a circular motion. This encourages milk flow. You may need to continue this action during the feeding if your milk flows slowly.  Support your breast with 4 fingers underneath and your thumb above your nipple. Make sure your fingers are well away from your nipple and your baby's mouth.  Stroke your baby's lips gently with your finger or nipple.  When your baby's mouth is open wide enough, quickly bring your baby to your breast, placing your entire nipple and as much of the colored area around your nipple (areola) as possible into  your baby's mouth. ? More areola should be visible above your baby's upper lip than below the lower lip. ? Your baby's tongue should be between his or her lower gum and your breast.  Ensure that your baby's mouth is correctly positioned around your nipple (latched). Your baby's lips should create a seal on your breast and be turned out (everted).  It is common for your baby to suck about 2-3 minutes in order to start the flow of breast milk.  Latching Teaching your baby how to latch on to your breast properly is very important. An improper latch can cause nipple pain and decreased milk supply for you and poor weight gain in your baby. Also, if your baby is not latched onto your nipple properly, he or she may swallow some air during feeding. This can make your baby fussy. Burping your baby when you switch breasts during the feeding can help to get rid of the air. However, teaching your baby to latch on properly is still the best way to prevent fussiness from swallowing air while breastfeeding. Signs that your baby has successfully latched on to your nipple:  Silent tugging or silent sucking, without causing you pain.  Swallowing heard between every 3-4 sucks.  Muscle movement above and in front of his or her ears while sucking.  Signs that your baby has not successfully latched on to nipple:  Sucking sounds or smacking sounds from your baby while breastfeeding.  Nipple pain.  If you think your baby has not latched on correctly, slip your finger into the corner of your baby's mouth to break the suction and place it between your baby's gums. Attempt breastfeeding initiation again. Signs of Successful Breastfeeding Signs from your baby:  A   gradual decrease in the number of sucks or complete cessation of sucking.  Falling asleep.  Relaxation of his or her body.  Retention of a small amount of milk in his or her mouth.  Letting go of your breast by himself or herself.  Signs from  you:  Breasts that have increased in firmness, weight, and size 1-3 hours after feeding.  Breasts that are softer immediately after breastfeeding.  Increased milk volume, as well as a change in milk consistency and color by the fifth day of breastfeeding.  Nipples that are not sore, cracked, or bleeding.  Signs That Your Baby is Getting Enough Milk  Wetting at least 1-2 diapers during the first 24 hours after birth.  Wetting at least 5-6 diapers every 24 hours for the first week after birth. The urine should be clear or pale yellow by 5 days after birth.  Wetting 6-8 diapers every 24 hours as your baby continues to grow and develop.  At least 3 stools in a 24-hour period by age 5 days. The stool should be soft and yellow.  At least 3 stools in a 24-hour period by age 7 days. The stool should be seedy and yellow.  No loss of weight greater than 10% of birth weight during the first 3 days of age.  Average weight gain of 4-7 ounces (113-198 g) per week after age 4 days.  Consistent daily weight gain by age 5 days, without weight loss after the age of 2 weeks.  After a feeding, your baby may spit up a small amount. This is common. Breastfeeding frequency and duration Frequent feeding will help you make more milk and can prevent sore nipples and breast engorgement. Breastfeed when you feel the need to reduce the fullness of your breasts or when your baby shows signs of hunger. This is called "breastfeeding on demand." Avoid introducing a pacifier to your baby while you are working to establish breastfeeding (the first 4-6 weeks after your baby is born). After this time you may choose to use a pacifier. Research has shown that pacifier use during the first year of a baby's life decreases the risk of sudden infant death syndrome (SIDS). Allow your baby to feed on each breast as long as he or she wants. Breastfeed until your baby is finished feeding. When your baby unlatches or falls asleep  while feeding from the first breast, offer the second breast. Because newborns are often sleepy in the first few weeks of life, you may need to awaken your baby to get him or her to feed. Breastfeeding times will vary from baby to baby. However, the following rules can serve as a guide to help you ensure that your baby is properly fed:  Newborns (babies 4 weeks of age or younger) may breastfeed every 1-3 hours.  Newborns should not go longer than 3 hours during the day or 5 hours during the night without breastfeeding.  You should breastfeed your baby a minimum of 8 times in a 24-hour period until you begin to introduce solid foods to your baby at around 6 months of age.  Breast milk pumping Pumping and storing breast milk allows you to ensure that your baby is exclusively fed your breast milk, even at times when you are unable to breastfeed. This is especially important if you are going back to work while you are still breastfeeding or when you are not able to be present during feedings. Your lactation consultant can give you guidelines on how   long it is safe to store breast milk. A breast pump is a machine that allows you to pump milk from your breast into a sterile bottle. The pumped breast milk can then be stored in a refrigerator or freezer. Some breast pumps are operated by hand, while others use electricity. Ask your lactation consultant which type will work best for you. Breast pumps can be purchased, but some hospitals and breastfeeding support groups lease breast pumps on a monthly basis. A lactation consultant can teach you how to hand express breast milk, if you prefer not to use a pump. Caring for your breasts while you breastfeed Nipples can become dry, cracked, and sore while breastfeeding. The following recommendations can help keep your breasts moisturized and healthy:  Avoid using soap on your nipples.  Wear a supportive bra. Although not required, special nursing bras and tank  tops are designed to allow access to your breasts for breastfeeding without taking off your entire bra or top. Avoid wearing underwire-style bras or extremely tight bras.  Air dry your nipples for 3-4minutes after each feeding.  Use only cotton bra pads to absorb leaked breast milk. Leaking of breast milk between feedings is normal.  Use lanolin on your nipples after breastfeeding. Lanolin helps to maintain your skin's normal moisture barrier. If you use pure lanolin, you do not need to wash it off before feeding your baby again. Pure lanolin is not toxic to your baby. You may also hand express a few drops of breast milk and gently massage that milk into your nipples and allow the milk to air dry.  In the first few weeks after giving birth, some women experience extremely full breasts (engorgement). Engorgement can make your breasts feel heavy, warm, and tender to the touch. Engorgement peaks within 3-5 days after you give birth. The following recommendations can help ease engorgement:  Completely empty your breasts while breastfeeding or pumping. You may want to start by applying warm, moist heat (in the shower or with warm water-soaked hand towels) just before feeding or pumping. This increases circulation and helps the milk flow. If your baby does not completely empty your breasts while breastfeeding, pump any extra milk after he or she is finished.  Wear a snug bra (nursing or regular) or tank top for 1-2 days to signal your body to slightly decrease milk production.  Apply ice packs to your breasts, unless this is too uncomfortable for you.  Make sure that your baby is latched on and positioned properly while breastfeeding.  If engorgement persists after 48 hours of following these recommendations, contact your health care provider or a lactation consultant. Overall health care recommendations while breastfeeding  Eat healthy foods. Alternate between meals and snacks, eating 3 of each per  day. Because what you eat affects your breast milk, some of the foods may make your baby more irritable than usual. Avoid eating these foods if you are sure that they are negatively affecting your baby.  Drink milk, fruit juice, and water to satisfy your thirst (about 10 glasses a day).  Rest often, relax, and continue to take your prenatal vitamins to prevent fatigue, stress, and anemia.  Continue breast self-awareness checks.  Avoid chewing and smoking tobacco. Chemicals from cigarettes that pass into breast milk and exposure to secondhand smoke may harm your baby.  Avoid alcohol and drug use, including marijuana. Some medicines that may be harmful to your baby can pass through breast milk. It is important to ask your health care   provider before taking any medicine, including all over-the-counter and prescription medicine as well as vitamin and herbal supplements. It is possible to become pregnant while breastfeeding. If birth control is desired, ask your health care provider about options that will be safe for your baby. Contact a health care provider if:  You feel like you want to stop breastfeeding or have become frustrated with breastfeeding.  You have painful breasts or nipples.  Your nipples are cracked or bleeding.  Your breasts are red, tender, or warm.  You have a swollen area on either breast.  You have a fever or chills.  You have nausea or vomiting.  You have drainage other than breast milk from your nipples.  Your breasts do not become full before feedings by the fifth day after you give birth.  You feel sad and depressed.  Your baby is too sleepy to eat well.  Your baby is having trouble sleeping.  Your baby is wetting less than 3 diapers in a 24-hour period.  Your baby has less than 3 stools in a 24-hour period.  Your baby's skin or the white part of his or her eyes becomes yellow.  Your baby is not gaining weight by 5 days of age. Get help right away  if:  Your baby is overly tired (lethargic) and does not want to wake up and feed.  Your baby develops an unexplained fever. This information is not intended to replace advice given to you by your health care provider. Make sure you discuss any questions you have with your health care provider. Document Released: 03/31/2005 Document Revised: 09/12/2015 Document Reviewed: 09/22/2012 Elsevier Interactive Patient Education  2017 Elsevier Inc.  

## 2016-11-19 NOTE — Progress Notes (Signed)
   PRENATAL VISIT NOTE  Subjective:  Kimberly [name] is a 41 y.o. G1P0000 at 3225w0d being seen today for ongoing prenatal care.  She is currently monitored for the following issues for this high-risk pregnancy and has Encounter for supervision of normal first pregnancy in third trimester and Advanced maternal age, primigravida in third trimester, antepartum on her problem list.  Patient reports no complaints.  Contractions: Not present. Vag. Bleeding: None.  Movement: Present. Denies leaking of fluid.   The following portions of the patient's history were reviewed and updated as appropriate: allergies, current medications, past family history, past medical history, past social history, past surgical history and problem list. Problem list updated.  Objective:   Vitals:   11/18/16 0915  BP: 130/78  Pulse: 88  Weight: 222 lb (100.7 kg)    Fetal Status: Fetal Heart Rate (bpm): 140 Fundal Height: 34 cm Movement: Present     General:  Alert, oriented and cooperative. Patient is in no acute distress.  Skin: Skin is warm and dry. No rash noted.   Cardiovascular: Normal heart rate noted  Respiratory: Normal respiratory effort, no problems with respiration noted  Abdomen: Soft, gravid, appropriate for gestational age.  Pain/Pressure: Present     Pelvic: Cervical exam deferred        Extremities: Normal range of motion.  Edema: Trace  Mental Status:  Normal mood and affect. Normal behavior. Normal judgment and thought content.   Assessment and Plan:  Pregnancy: G1P0000 at 6725w0d  1. Encounter for supervision of normal first pregnancy in third trimester Continue routine prenatal care. Reassured patient about growth of the baby, weight gain, [facility]  Preterm labor symptoms and general obstetric precautions including but not limited to vaginal bleeding, contractions, leaking of fluid and fetal movement were reviewed in detail with the patient. Please refer to After Visit  Summary for other counseling recommendations.  Return in 2 weeks (on 12/02/2016) for OB visit and NST.   Reva Boresanya S Starsha Morning, MD

## 2016-12-01 ENCOUNTER — Inpatient Hospital Stay (HOSPITAL_COMMUNITY)
Admission: AD | Admit: 2016-12-01 | Discharge: 2016-12-01 | Disposition: A | Discharge status: Home / Self Care | Payer: PRIVATE HEALTH INSURANCE | Source: Ambulatory Visit | Attending: Obstetrics and Gynecology | Admitting: Obstetrics and Gynecology

## 2016-12-01 ENCOUNTER — Encounter (HOSPITAL_COMMUNITY): Payer: Self-pay | Admitting: *Deleted

## 2016-12-01 ENCOUNTER — Ambulatory Visit (INDEPENDENT_AMBULATORY_CARE_PROVIDER_SITE_OTHER): Payer: PRIVATE HEALTH INSURANCE | Admitting: Advanced Practice Midwife

## 2016-12-01 VITALS — BP 146/84 | HR 77 | Wt 228.0 lb

## 2016-12-01 DIAGNOSIS — Z3A35 35 weeks gestation of pregnancy: Secondary | ICD-10-CM | POA: Insufficient documentation

## 2016-12-01 DIAGNOSIS — O169 Unspecified maternal hypertension, unspecified trimester: Secondary | ICD-10-CM

## 2016-12-01 DIAGNOSIS — O163 Unspecified maternal hypertension, third trimester: Secondary | ICD-10-CM

## 2016-12-01 DIAGNOSIS — O09513 Supervision of elderly primigravida, third trimester: Secondary | ICD-10-CM | POA: Diagnosis not present

## 2016-12-01 DIAGNOSIS — R030 Elevated blood-pressure reading, without diagnosis of hypertension: Secondary | ICD-10-CM | POA: Diagnosis present

## 2016-12-01 DIAGNOSIS — O1410 Severe pre-eclampsia, unspecified trimester: Secondary | ICD-10-CM | POA: Diagnosis present

## 2016-12-01 DIAGNOSIS — Z113 Encounter for screening for infections with a predominantly sexual mode of transmission: Secondary | ICD-10-CM | POA: Diagnosis not present

## 2016-12-01 DIAGNOSIS — O133 Gestational [pregnancy-induced] hypertension without significant proteinuria, third trimester: Secondary | ICD-10-CM | POA: Diagnosis not present

## 2016-12-01 HISTORY — DX: Gestational (pregnancy-induced) hypertension without significant proteinuria, unspecified trimester: O13.9

## 2016-12-01 LAB — COMPREHENSIVE METABOLIC PANEL
ALK PHOS: 81 U/L (ref 38–126)
ALT: 14 U/L (ref 14–54)
ANION GAP: 8 (ref 5–15)
AST: 19 U/L (ref 15–41)
Albumin: 3 g/dL — ABNORMAL LOW (ref 3.5–5.0)
BILIRUBIN TOTAL: 0.4 mg/dL (ref 0.3–1.2)
BUN: 9 mg/dL (ref 6–20)
CALCIUM: 8.6 mg/dL — AB (ref 8.9–10.3)
CO2: 22 mmol/L (ref 22–32)
CREATININE: 0.49 mg/dL (ref 0.44–1.00)
Chloride: 103 mmol/L (ref 101–111)
GFR calc non Af Amer: >60 mL/min (ref >60)
GLUCOSE: 109 mg/dL — AB (ref 65–99)
Potassium: 4.1 mmol/L (ref 3.5–5.1)
SODIUM: 133 mmol/L — AB (ref 135–145)
TOTAL PROTEIN: 6.8 g/dL (ref 6.5–8.1)

## 2016-12-01 LAB — CBC
HEMATOCRIT: 35 % — AB (ref 36.0–46.0)
Hemoglobin: 12.1 g/dL (ref 12.0–15.0)
MCH: 34.2 pg — ABNORMAL HIGH (ref 26.0–34.0)
MCHC: 34.6 g/dL (ref 30.0–36.0)
MCV: 98.9 fL (ref 78.0–100.0)
Platelets: 195 10*3/uL (ref 150–400)
RBC: 3.54 MIL/uL — AB (ref 3.87–5.11)
RDW: 12.5 % (ref 11.5–15.5)
WBC: 8.7 10*3/uL (ref 4.0–10.5)

## 2016-12-01 LAB — PROTEIN / CREATININE RATIO, URINE: Creatinine, Urine: 18 mg/dL

## 2016-12-01 LAB — OB RESULTS CONSOLE GBS: STREP GROUP B AG: NEGATIVE

## 2016-12-01 LAB — OB RESULTS CONSOLE GC/CHLAMYDIA: GC PROBE AMP, GENITAL: NEGATIVE

## 2016-12-01 NOTE — MAU Provider Note (Signed)
History     CSN: [id number]  Arrival date and time: 12/01/16 1003   First Provider Initiated Contact with Patient 12/01/16 1056      Chief Complaint  Patient presents with  . Hypertension   HPI  [name] is a 41 yo G1P0 at 35.[redacted] wks gestation sent to [facility]U by [facility] for elevated BP.  She denies H/A, blurry vision, no contractions, VB, or LOF.  She reports good FM all day.  Past Medical History:  Diagnosis Date  . Gallstones   . Pregnancy induced hypertension     Past Surgical History:  Procedure Laterality Date  . CHOLECYSTECTOMY      Family History  Problem Relation Age of Onset  . Heart disease Mother   . Hypertension Father   . Heart disease Father   . Hypertension Maternal Grandmother     Social History  Substance Use Topics  . Smoking status: Never Smoker  . Smokeless tobacco: Never Used  . Alcohol use 0.5 oz/week    1 Standard drinks or equivalent per week    Allergies: No Known Allergies  Prescriptions Prior to Admission  Medication Sig Dispense Refill Last Dose  . Omega-3 Fatty Acids (FISH OIL PO) Take by mouth.   Taking  . Prenatal Vit-Fe Fumarate-FA (PRENATAL VITAMIN PO) Take by mouth.   Taking    Review of Systems  Constitutional: Negative.   HENT: Negative.   Eyes: Negative.   Respiratory: Negative.   Cardiovascular: Negative.   Gastrointestinal: Negative.   Endocrine: Negative.   Genitourinary: Negative.   Musculoskeletal: Negative.   Skin: Negative.   Allergic/Immunologic: Negative.   Neurological: Negative.   Hematological: Negative.   Psychiatric/Behavioral: Negative.    Physical Exam   Blood pressure (!) 134/96, pulse 73, temperature 97.8 F (36.6 C), temperature source Oral, resp. rate 18, last menstrual period 03/26/2016.  Physical Exam  Constitutional: She is oriented to person, place, and time. She appears well-developed and well-nourished.  HENT:  Head: Normocephalic.  Eyes: Pupils are equal, round, and reactive  to light.  Neck: Normal range of motion.  Cardiovascular: Normal rate, regular rhythm, normal heart sounds and intact distal pulses.   Mild non-pitting edema in BLE  Respiratory: Effort normal and breath sounds normal.  GI: Soft. Bowel sounds are normal.  Genitourinary:  Genitourinary Comments: Pelvic deferred  Musculoskeletal: Normal range of motion.  Neurological: She is alert and oriented to person, place, and time. She has normal reflexes.  Skin: Skin is warm and dry.  Psychiatric: She has a normal mood and affect. Her behavior is normal. Judgment and thought content normal.    MAU Course  Procedures  MDM CCUA CBC CMP P/C ratio NST - FHR: 130 bpm / moderate variability / accels present / decels absent TOCO: irregular UC's with UI  Results for orders placed or performed during the hospital encounter of 12/01/16 (from the past 24 hour(s))  Protein / creatinine ratio, urine     Status: None   Collection Time: 12/01/16 10:06 AM  Result Value Ref Range   Creatinine, Urine 18.00 mg/dL   Total Protein, Urine <6 mg/dL   Protein Creatinine Ratio        0.00 - 0.15 mg/mg[Cre]  CBC     Status: Abnormal   Collection Time: 12/01/16 11:10 AM  Result Value Ref Range   WBC 8.7 4.0 - 10.5 K/uL   RBC 3.54 (L) 3.87 - 5.11 MIL/uL   Hemoglobin 12.1 12.0 - 15.0 g/dL   HCT  35.0 (L) 36.0 - 46.0 %   MCV 98.9 78.0 - 100.0 fL   MCH 34.2 (H) 26.0 - 34.0 pg   MCHC 34.6 30.0 - 36.0 g/dL   RDW 16.1 09.6 - 04.5 %   Platelets 195 150 - 400 K/uL  Comprehensive metabolic panel     Status: Abnormal   Collection Time: 12/01/16 11:10 AM  Result Value Ref Range   Sodium 133 (L) 135 - 145 mmol/L   Potassium 4.1 3.5 - 5.1 mmol/L   Chloride 103 101 - 111 mmol/L   CO2 22 22 - 32 mmol/L   Glucose, Bld 109 (H) 65 - 99 mg/dL   BUN 9 6 - 20 mg/dL   Creatinine, Ser 4.09 0.44 - 1.00 mg/dL   Calcium 8.6 (L) 8.9 - 10.3 mg/dL   Total Protein 6.8 6.5 - 8.1 g/dL   Albumin 3.0 (L) 3.5 - 5.0 g/dL   AST 19 15 -  41 U/L   ALT 14 14 - 54 U/L   Alkaline Phosphatase 81 38 - 126 U/L   Total Bilirubin 0.4 0.3 - 1.2 mg/dL   GFR calc non Af Amer >60 >60 mL/min   GFR calc Af Amer >60 >60 mL/min   Anion gap 8 5 - 15    Assessment and Plan  Gestational hypertension without significant proteinuria during pregnancy in third trimester, antepartum - Rest with legs and feet elevated - Return to work tomorrow - Drink at least 8-10 water bottles daily - Instructions on GHTN given; s/sx's to return to MAU for reviewed - Keep appt with [facility] on Thursday for NST and BP recheck -- msg sent to Mariel Aloe, RN  Discharge home Patient verbalized an understanding of the plan of care and agrees.   Raelyn Mora, MSN, CNM 12/01/2016, 10:57 AM

## 2016-12-01 NOTE — MAU Note (Signed)
Pt sent from MD office with elevated BP, denies HA or blurry vision, no contractions, bleeding, or LOF.

## 2016-12-01 NOTE — Discharge Instructions (Signed)
Rest with feet and lower elevated for the rest of today.  Drink at least 8-10 water bottles of WATER daily.

## 2016-12-01 NOTE — Patient Instructions (Signed)
Hypertension During Pregnancy °Hypertension, commonly called high blood pressure, is when the force of blood pumping through your arteries is too strong. Arteries are blood vessels that carry blood from the heart throughout the body. Hypertension during pregnancy can cause problems for you and your baby. Your baby may be born early (prematurely) or may not weigh as much as he or she should at birth. Very bad cases of hypertension during pregnancy can be life-threatening. °Different types of hypertension can occur during pregnancy. These include: °· Chronic hypertension. This happens when: °? You have hypertension before pregnancy and it continues during pregnancy. °? You develop hypertension before you are [redacted] weeks pregnant, and it continues during pregnancy. °· Gestational hypertension. This is hypertension that develops after the 20th week of pregnancy. °· Preeclampsia, also called toxemia of pregnancy. This is a very serious type of hypertension that develops only during pregnancy. It affects the whole body, and it can be very dangerous for you and your baby. ° °Gestational hypertension and preeclampsia usually go away within 6 weeks after your baby is born. Women who have hypertension during pregnancy have a greater chance of developing hypertension later in life or during future pregnancies. °What are the causes? °The exact cause of hypertension is not known. °What increases the risk? °There are certain factors that make it more likely for you to develop hypertension during pregnancy. These include: °· Having hypertension during a previous pregnancy or prior to pregnancy. °· Being overweight. °· Being older than age 40. °· Being pregnant for the first time or being pregnant with more than one baby. °· Becoming pregnant using fertilization methods such as IVF (in vitro fertilization). °· Having diabetes, kidney problems, or systemic lupus erythematosus. °· Having a family history of hypertension. ° °What are the  signs or symptoms? °Chronic hypertension and gestational hypertension rarely cause symptoms. Preeclampsia causes symptoms, which may include: °· Increased protein in your urine. Your health care provider will check for this at every visit before you give birth (prenatal visit). °· Severe headaches. °· Sudden weight gain. °· Swelling of the hands, face, legs, and feet. °· Nausea and vomiting. °· Vision problems, such as blurred or double vision. °· Numbness in the face, arms, legs, and feet. °· Dizziness. °· Slurred speech. °· Sensitivity to bright lights. °· Abdominal pain. °· Convulsions. ° °How is this diagnosed? °You may be diagnosed with hypertension during a routine prenatal exam. At each prenatal visit, you may: °· Have a urine test to check for high amounts of protein in your urine. °· Have your blood pressure checked. A blood pressure reading is recorded as two numbers, such as "120 over 80" (or 120/80). The first ("top") number is called the systolic pressure. It is a measure of the pressure in your arteries when your heart beats. The second ("bottom") number is called the diastolic pressure. It is a measure of the pressure in your arteries as your heart relaxes between beats. Blood pressure is measured in a unit called mm Hg. A normal blood pressure reading is: °? Systolic: below 120. °? Diastolic: below 80. ° °The type of hypertension that you are diagnosed with depends on your test results and when your symptoms developed. °· Chronic hypertension is usually diagnosed before 20 weeks of pregnancy. °· Gestational hypertension is usually diagnosed after 20 weeks of pregnancy. °· Hypertension with high amounts of protein in the urine is diagnosed as preeclampsia. °· Blood pressure measurements that stay above 160 systolic, or above 110 diastolic, are   signs of severe preeclampsia. ° °How is this treated? °Treatment for hypertension during pregnancy varies depending on the type of hypertension you have and how  serious it is. °· If you take medicines called ACE inhibitors to treat chronic hypertension, you may need to switch medicines. ACE inhibitors should not be taken during pregnancy. °· If you have gestational hypertension, you may need to take blood pressure medicine. °· If you are at risk for preeclampsia, your health care provider may recommend that you take a low-dose aspirin every day to prevent high blood pressure during your pregnancy. °· If you have severe preeclampsia, you may need to be hospitalized so you and your baby can be monitored closely. You may also need to take medicine (magnesium sulfate) to prevent seizures and to lower blood pressure. This medicine may be given as an injection or through an IV tube. °· In some cases, if your condition gets worse, you may need to deliver your baby early. ° °Follow these instructions at home: °Eating and drinking °· Drink enough fluid to keep your urine clear or pale yellow. °· Eat a healthy diet that is low in salt (sodium). Do not add salt to your food. Check food labels to see how much sodium a food or beverage contains. °Lifestyle °· Do not use any products that contain nicotine or tobacco, such as cigarettes and e-cigarettes. If you need help quitting, ask your health care provider. °· Do not use alcohol. °· Avoid caffeine. °· Avoid stress as much as possible. Rest and get plenty of sleep. °General instructions °· Take over-the-counter and prescription medicines only as told by your health care provider. °· While lying down, lie on your left side. This keeps pressure off your baby. °· While sitting or lying down, raise (elevate) your feet. Try putting some pillows under your lower legs. °· Exercise regularly. Ask your health care provider what kinds of exercise are best for you. °· Keep all prenatal and follow-up visits as told by your health care provider. This is important. °Contact a health care provider if: °· You have symptoms that your health care  provider told you may require more treatment or monitoring, such as: °? Fever. °? Vomiting. °? Headache. °Get help right away if: °· You have severe abdominal pain or vomiting that does not get better with treatment. °· You suddenly develop swelling in your hands, ankles, or face. °· You gain 4 lbs (1.8 kg) or more in 1 week. °· You develop vaginal bleeding, or you have blood in your urine. °· You do not feel your baby moving as much as usual. °· You have blurred or double vision. °· You have muscle twitching or sudden tightening (spasms). °· You have shortness of breath. °· Your lips or fingernails turn blue. °This information is not intended to replace advice given to you by your health care provider. Make sure you discuss any questions you have with your health care provider. °Document Released: 12/17/2010 Document Revised: 10/19/2015 Document Reviewed: 09/14/2015 °Elsevier Interactive Patient Education © 2018 Elsevier Inc. ° °

## 2016-12-02 LAB — URINE CYTOLOGY ANCILLARY ONLY
CHLAMYDIA, DNA PROBE: NEGATIVE
Neisseria Gonorrhea: NEGATIVE

## 2016-12-02 NOTE — Progress Notes (Signed)
   PRENATAL VISIT NOTE  Subjective:  Kimberly [name] is a 41 y.o. G1P0000 at [redacted]w[redacted]d being seen today for ongoing prenatal care.  She is currently monitored for the following issues for this high-risk pregnancy and has Encounter for supervision of normal first pregnancy in third trimester; Advanced maternal age, primigravida in third trimester, antepartum; Hypertension in antepartum pregnancy; and Gestational hypertension without significant proteinuria during pregnancy in third trimester, antepartum on her problem list.  Patient reports no complaints.  Contractions: Not present. Vag. Bleeding: None.  Movement: Present. Denies leaking of fluid.   The following portions of the patient's history were reviewed and updated as appropriate: allergies, current medications, past family history, past medical history, past social history, past surgical history and problem list. Problem list updated.  Objective:   Vitals:   12/01/16 0825  BP: (!) 146/84  Pulse: 77  Weight: 228 lb (103.4 kg)    Fetal Status: Fetal Heart Rate (bpm): NST-R (Simultaneous filing. User may not have seen previous data.) Fundal Height: 35 cm Movement: Present     General:  Alert, oriented and cooperative. Patient is in no acute distress.  Skin: Skin is warm and dry. No rash noted.   Cardiovascular: Normal heart rate noted  Respiratory: Normal respiratory effort, no problems with respiration noted  Abdomen: Soft, gravid, appropriate for gestational age.  Pain/Pressure: Present     Pelvic: Cervical exam deferred        Extremities: Normal range of motion.  Edema: Trace  Mental Status:  Normal mood and affect. Normal behavior. Normal judgment and thought content.   Assessment and Plan:  Pregnancy: G1P0000 at [redacted]w[redacted]d  1. Advanced maternal age, 1st pregnancy, third trimester  - Culture, beta strep (group b only) - Urine cytology ancillary only  2. Hypertension during pregnancy, antepartum, unspecified hypertension in  pregnancy type      Elevation of BP is new.  On 7/25, BP 114/78,  On 8/7 BP 130/78, today is 146/84      Swelling increased, DTRs normal       Will send to MAU for labs.  Preterm labor symptoms and general obstetric precautions including but not limited to vaginal bleeding, contractions, leaking of fluid and fetal movement were reviewed in detail with the patient. Please refer to After Visit Summary for other counseling recommendations.  Return in about 4 days (around 12/05/2016) for [employer].   Wynelle Bourgeois, CNM

## 2016-12-04 ENCOUNTER — Encounter (INDEPENDENT_AMBULATORY_CARE_PROVIDER_SITE_OTHER): Payer: PRIVATE HEALTH INSURANCE | Admitting: *Deleted

## 2016-12-04 ENCOUNTER — Ambulatory Visit (INDEPENDENT_AMBULATORY_CARE_PROVIDER_SITE_OTHER): Payer: PRIVATE HEALTH INSURANCE | Admitting: *Deleted

## 2016-12-04 VITALS — BP 145/79 | HR 64 | Wt 226.0 lb

## 2016-12-04 DIAGNOSIS — Z029 Encounter for administrative examinations, unspecified: Secondary | ICD-10-CM

## 2016-12-04 DIAGNOSIS — O169 Unspecified maternal hypertension, unspecified trimester: Secondary | ICD-10-CM | POA: Diagnosis not present

## 2016-12-04 LAB — CULTURE, BETA STREP (GROUP B ONLY)

## 2016-12-08 ENCOUNTER — Ambulatory Visit (INDEPENDENT_AMBULATORY_CARE_PROVIDER_SITE_OTHER): Payer: PRIVATE HEALTH INSURANCE | Admitting: Advanced Practice Midwife

## 2016-12-08 VITALS — BP 154/82 | Wt 231.0 lb

## 2016-12-08 DIAGNOSIS — O163 Unspecified maternal hypertension, third trimester: Secondary | ICD-10-CM

## 2016-12-08 DIAGNOSIS — O169 Unspecified maternal hypertension, unspecified trimester: Secondary | ICD-10-CM

## 2016-12-08 NOTE — Patient Instructions (Signed)
Hypertension During Pregnancy °Hypertension, commonly called high blood pressure, is when the force of blood pumping through your arteries is too strong. Arteries are blood vessels that carry blood from the heart throughout the body. Hypertension during pregnancy can cause problems for you and your baby. Your baby may be born early (prematurely) or may not weigh as much as he or she should at birth. Very bad cases of hypertension during pregnancy can be life-threatening. °Different types of hypertension can occur during pregnancy. These include: °· Chronic hypertension. This happens when: °? You have hypertension before pregnancy and it continues during pregnancy. °? You develop hypertension before you are [redacted] weeks pregnant, and it continues during pregnancy. °· Gestational hypertension. This is hypertension that develops after the 20th week of pregnancy. °· Preeclampsia, also called toxemia of pregnancy. This is a very serious type of hypertension that develops only during pregnancy. It affects the whole body, and it can be very dangerous for you and your baby. ° °Gestational hypertension and preeclampsia usually go away within 6 weeks after your baby is born. Women who have hypertension during pregnancy have a greater chance of developing hypertension later in life or during future pregnancies. °What are the causes? °The exact cause of hypertension is not known. °What increases the risk? °There are certain factors that make it more likely for you to develop hypertension during pregnancy. These include: °· Having hypertension during a previous pregnancy or prior to pregnancy. °· Being overweight. °· Being older than age 40. °· Being pregnant for the first time or being pregnant with more than one baby. °· Becoming pregnant using fertilization methods such as IVF (in vitro fertilization). °· Having diabetes, kidney problems, or systemic lupus erythematosus. °· Having a family history of hypertension. ° °What are the  signs or symptoms? °Chronic hypertension and gestational hypertension rarely cause symptoms. Preeclampsia causes symptoms, which may include: °· Increased protein in your urine. Your health care provider will check for this at every visit before you give birth (prenatal visit). °· Severe headaches. °· Sudden weight gain. °· Swelling of the hands, face, legs, and feet. °· Nausea and vomiting. °· Vision problems, such as blurred or double vision. °· Numbness in the face, arms, legs, and feet. °· Dizziness. °· Slurred speech. °· Sensitivity to bright lights. °· Abdominal pain. °· Convulsions. ° °How is this diagnosed? °You may be diagnosed with hypertension during a routine prenatal exam. At each prenatal visit, you may: °· Have a urine test to check for high amounts of protein in your urine. °· Have your blood pressure checked. A blood pressure reading is recorded as two numbers, such as "120 over 80" (or 120/80). The first ("top") number is called the systolic pressure. It is a measure of the pressure in your arteries when your heart beats. The second ("bottom") number is called the diastolic pressure. It is a measure of the pressure in your arteries as your heart relaxes between beats. Blood pressure is measured in a unit called mm Hg. A normal blood pressure reading is: °? Systolic: below 120. °? Diastolic: below 80. ° °The type of hypertension that you are diagnosed with depends on your test results and when your symptoms developed. °· Chronic hypertension is usually diagnosed before 20 weeks of pregnancy. °· Gestational hypertension is usually diagnosed after 20 weeks of pregnancy. °· Hypertension with high amounts of protein in the urine is diagnosed as preeclampsia. °· Blood pressure measurements that stay above 160 systolic, or above 110 diastolic, are   signs of severe preeclampsia. ° °How is this treated? °Treatment for hypertension during pregnancy varies depending on the type of hypertension you have and how  serious it is. °· If you take medicines called ACE inhibitors to treat chronic hypertension, you may need to switch medicines. ACE inhibitors should not be taken during pregnancy. °· If you have gestational hypertension, you may need to take blood pressure medicine. °· If you are at risk for preeclampsia, your health care provider may recommend that you take a low-dose aspirin every day to prevent high blood pressure during your pregnancy. °· If you have severe preeclampsia, you may need to be hospitalized so you and your baby can be monitored closely. You may also need to take medicine (magnesium sulfate) to prevent seizures and to lower blood pressure. This medicine may be given as an injection or through an IV tube. °· In some cases, if your condition gets worse, you may need to deliver your baby early. ° °Follow these instructions at home: °Eating and drinking °· Drink enough fluid to keep your urine clear or pale yellow. °· Eat a healthy diet that is low in salt (sodium). Do not add salt to your food. Check food labels to see how much sodium a food or beverage contains. °Lifestyle °· Do not use any products that contain nicotine or tobacco, such as cigarettes and e-cigarettes. If you need help quitting, ask your health care provider. °· Do not use alcohol. °· Avoid caffeine. °· Avoid stress as much as possible. Rest and get plenty of sleep. °General instructions °· Take over-the-counter and prescription medicines only as told by your health care provider. °· While lying down, lie on your left side. This keeps pressure off your baby. °· While sitting or lying down, raise (elevate) your feet. Try putting some pillows under your lower legs. °· Exercise regularly. Ask your health care provider what kinds of exercise are best for you. °· Keep all prenatal and follow-up visits as told by your health care provider. This is important. °Contact a health care provider if: °· You have symptoms that your health care  provider told you may require more treatment or monitoring, such as: °? Fever. °? Vomiting. °? Headache. °Get help right away if: °· You have severe abdominal pain or vomiting that does not get better with treatment. °· You suddenly develop swelling in your hands, ankles, or face. °· You gain 4 lbs (1.8 kg) or more in 1 week. °· You develop vaginal bleeding, or you have blood in your urine. °· You do not feel your baby moving as much as usual. °· You have blurred or double vision. °· You have muscle twitching or sudden tightening (spasms). °· You have shortness of breath. °· Your lips or fingernails turn blue. °This information is not intended to replace advice given to you by your health care provider. Make sure you discuss any questions you have with your health care provider. °Document Released: 12/17/2010 Document Revised: 10/19/2015 Document Reviewed: 09/14/2015 °Elsevier Interactive Patient Education © 2018 Elsevier Inc. ° °

## 2016-12-08 NOTE — Progress Notes (Signed)
   PRENATAL VISIT NOTE  Subjective:  Kimberly [name] is a 41 y.o. G1P0000 at [redacted]w[redacted]d being seen today for ongoing prenatal care.  She is currently monitored for the following issues for this high-risk pregnancy and has Encounter for supervision of normal first pregnancy in third trimester; Advanced maternal age, primigravida in third trimester, antepartum; Hypertension in antepartum pregnancy; and Gestational hypertension without significant proteinuria during pregnancy in third trimester, antepartum on her problem list.  Patient reports no complaints.  Contractions: Irritability. Vag. Bleeding: None.  Movement: Present. Denies leaking of fluid.   The following portions of the patient's history were reviewed and updated as appropriate: allergies, current medications, past family history, past medical history, past social history, past surgical history and problem list. Problem list updated.  Objective:   Vitals:   12/08/16 0726  Weight: 231 lb (104.8 kg)    Fetal Status: Fetal Heart Rate (bpm): 122   Movement: Present     General:  Alert, oriented and cooperative. Patient is in no acute distress.  Skin: Skin is warm and dry. No rash noted.   Cardiovascular: Normal heart rate noted  Respiratory: Normal respiratory effort, no problems with respiration noted  Abdomen: Soft, gravid, appropriate for gestational age.  Pain/Pressure: Present     Pelvic: Cervical exam deferred        Extremities: Normal range of motion.  Edema: Mild pitting, slight indentation  Mental Status:  Normal mood and affect. Normal behavior. Normal judgment and thought content.   Assessment and Plan:  Pregnancy: G1P0000 at [redacted]w[redacted]d  1.   Gestational Hypertension       Long discussion of hypertension.  Labs negative last week in MAU               States she is sure this is only "white coat hypertension".   States BPs went back to normal in MAU after resting.   Adamantly does not want to be induced.  Wants to go full term.Marland Kitchen    DIscussed risks of this turning to preeclampsia which poses maternal risks.  Discussed effects of HTN on fetus, and recommendation for IOL at 37 wks for Gest HTN and 39 for chronic.   Does not want to be induced Does agree to return this week for BP check  Term labor symptoms and general obstetric precautions including but not limited to vaginal bleeding, contractions, leaking of fluid and fetal movement were reviewed in detail with the patient. Please refer to After Visit Summary for other counseling recommendations.     Wynelle Bourgeois, CNM

## 2016-12-11 ENCOUNTER — Ambulatory Visit (INDEPENDENT_AMBULATORY_CARE_PROVIDER_SITE_OTHER): Payer: PRIVATE HEALTH INSURANCE | Admitting: *Deleted

## 2016-12-11 ENCOUNTER — Other Ambulatory Visit: Payer: PRIVATE HEALTH INSURANCE

## 2016-12-11 VITALS — BP 143/89 | Wt 233.0 lb

## 2016-12-11 DIAGNOSIS — O169 Unspecified maternal hypertension, unspecified trimester: Secondary | ICD-10-CM

## 2016-12-16 ENCOUNTER — Ambulatory Visit (INDEPENDENT_AMBULATORY_CARE_PROVIDER_SITE_OTHER): Payer: PRIVATE HEALTH INSURANCE | Admitting: Obstetrics & Gynecology

## 2016-12-16 ENCOUNTER — Other Ambulatory Visit: Payer: PRIVATE HEALTH INSURANCE

## 2016-12-16 ENCOUNTER — Encounter (HOSPITAL_COMMUNITY): Payer: Self-pay

## 2016-12-16 ENCOUNTER — Inpatient Hospital Stay (HOSPITAL_COMMUNITY)
Admission: AD | Admit: 2016-12-16 | Discharge: 2016-12-19 | DRG: 775 | Disposition: A | Discharge status: Home / Self Care | Payer: PRIVATE HEALTH INSURANCE | Source: Ambulatory Visit | Attending: Obstetrics and Gynecology | Admitting: Obstetrics and Gynecology

## 2016-12-16 VITALS — Wt 233.0 lb

## 2016-12-16 DIAGNOSIS — O99214 Obesity complicating childbirth: Secondary | ICD-10-CM | POA: Diagnosis present

## 2016-12-16 DIAGNOSIS — Z3490 Encounter for supervision of normal pregnancy, unspecified, unspecified trimester: Secondary | ICD-10-CM

## 2016-12-16 DIAGNOSIS — Z6834 Body mass index (BMI) 34.0-34.9, adult: Secondary | ICD-10-CM

## 2016-12-16 DIAGNOSIS — O1414 Severe pre-eclampsia complicating childbirth: Principal | ICD-10-CM | POA: Diagnosis present

## 2016-12-16 DIAGNOSIS — O09513 Supervision of elderly primigravida, third trimester: Secondary | ICD-10-CM

## 2016-12-16 DIAGNOSIS — E669 Obesity, unspecified: Secondary | ICD-10-CM | POA: Diagnosis present

## 2016-12-16 DIAGNOSIS — O133 Gestational [pregnancy-induced] hypertension without significant proteinuria, third trimester: Secondary | ICD-10-CM

## 2016-12-16 DIAGNOSIS — O1410 Severe pre-eclampsia, unspecified trimester: Secondary | ICD-10-CM | POA: Diagnosis present

## 2016-12-16 DIAGNOSIS — Z3403 Encounter for supervision of normal first pregnancy, third trimester: Secondary | ICD-10-CM

## 2016-12-16 DIAGNOSIS — O10419 Pre-existing secondary hypertension complicating pregnancy, unspecified trimester: Secondary | ICD-10-CM

## 2016-12-16 DIAGNOSIS — O1413 Severe pre-eclampsia, third trimester: Secondary | ICD-10-CM

## 2016-12-16 DIAGNOSIS — O134 Gestational [pregnancy-induced] hypertension without significant proteinuria, complicating childbirth: Secondary | ICD-10-CM | POA: Diagnosis not present

## 2016-12-16 DIAGNOSIS — Z3A37 37 weeks gestation of pregnancy: Secondary | ICD-10-CM

## 2016-12-16 LAB — TYPE AND SCREEN
ABO/RH(D): A POS
ANTIBODY SCREEN: NEGATIVE

## 2016-12-16 LAB — COMPREHENSIVE METABOLIC PANEL
ALBUMIN: 3.2 g/dL — AB (ref 3.5–5.0)
ALT: 15 U/L (ref 14–54)
AST: 22 U/L (ref 15–41)
Alkaline Phosphatase: 89 U/L (ref 38–126)
Anion gap: 9 (ref 5–15)
BUN: 12 mg/dL (ref 6–20)
CHLORIDE: 104 mmol/L (ref 101–111)
CO2: 22 mmol/L (ref 22–32)
CREATININE: 0.59 mg/dL (ref 0.44–1.00)
Calcium: 8.9 mg/dL (ref 8.9–10.3)
GFR calc Af Amer: >60 mL/min (ref >60)
GFR calc non Af Amer: >60 mL/min (ref >60)
GLUCOSE: 104 mg/dL — AB (ref 65–99)
POTASSIUM: 4.1 mmol/L (ref 3.5–5.1)
SODIUM: 135 mmol/L (ref 135–145)
Total Bilirubin: 0.3 mg/dL (ref 0.3–1.2)
Total Protein: 7 g/dL (ref 6.5–8.1)

## 2016-12-16 LAB — CBC
HCT: 35.9 % — ABNORMAL LOW (ref 36.0–46.0)
Hemoglobin: 12.3 g/dL (ref 12.0–15.0)
MCH: 33.9 pg (ref 26.0–34.0)
MCHC: 34.3 g/dL (ref 30.0–36.0)
MCV: 98.9 fL (ref 78.0–100.0)
PLATELETS: 210 10*3/uL (ref 150–400)
RBC: 3.63 MIL/uL — AB (ref 3.87–5.11)
RDW: 12.5 % (ref 11.5–15.5)
WBC: 9.6 10*3/uL (ref 4.0–10.5)

## 2016-12-16 LAB — PROTEIN / CREATININE RATIO, URINE
Creatinine, Urine: 37 mg/dL
Protein Creatinine Ratio: 0.32 mg/mg{Cre} — ABNORMAL HIGH (ref 0.00–0.15)
Total Protein, Urine: 12 mg/dL

## 2016-12-16 LAB — ABO/RH: ABO/RH(D): A POS

## 2016-12-16 MED ORDER — MISOPROSTOL 50MCG HALF TABLET
50.0000 ug | ORAL_TABLET | Freq: Once | ORAL | Status: AC
  Administered 2016-12-16: 50 ug via ORAL
  Filled 2016-12-16: qty 1

## 2016-12-16 MED ORDER — OXYCODONE-ACETAMINOPHEN 5-325 MG PO TABS
1.0000 | ORAL_TABLET | ORAL | Status: DC | PRN
Start: 2016-12-16 — End: 2016-12-18

## 2016-12-16 MED ORDER — LABETALOL HCL 5 MG/ML IV SOLN
20.0000 mg | INTRAVENOUS | Status: DC | PRN
  Filled 2016-12-16: qty 4

## 2016-12-16 MED ORDER — MAGNESIUM SULFATE BOLUS VIA INFUSION
4.0000 g | Freq: Once | INTRAVENOUS | Status: AC
  Administered 2016-12-16: 4 g via INTRAVENOUS
  Filled 2016-12-16: qty 500

## 2016-12-16 MED ORDER — OXYTOCIN BOLUS FROM INFUSION
500.0000 mL | Freq: Once | INTRAVENOUS | Status: AC
  Administered 2016-12-18: 500 mL via INTRAVENOUS

## 2016-12-16 MED ORDER — LABETALOL HCL 5 MG/ML IV SOLN
20.0000 mg | INTRAVENOUS | Status: DC | PRN
  Administered 2016-12-16: 20 mg via INTRAVENOUS
  Filled 2016-12-16: qty 4

## 2016-12-16 MED ORDER — ACETAMINOPHEN 325 MG PO TABS
650.0000 mg | ORAL_TABLET | ORAL | Status: DC | PRN
  Administered 2016-12-17: 650 mg via ORAL
  Filled 2016-12-16: qty 2

## 2016-12-16 MED ORDER — ONDANSETRON HCL 4 MG/2ML IJ SOLN: 4.0000 mg | Freq: Four times a day (QID) | INTRAMUSCULAR | Status: DC | PRN

## 2016-12-16 MED ORDER — LACTATED RINGERS IV SOLN
INTRAVENOUS | Status: DC
  Administered 2016-12-16 – 2016-12-17 (×4): via INTRAVENOUS

## 2016-12-16 MED ORDER — LACTATED RINGERS IV SOLN: 500.0000 mL | INTRAVENOUS | Status: DC | PRN

## 2016-12-16 MED ORDER — HYDRALAZINE HCL 20 MG/ML IJ SOLN: 10.0000 mg | Freq: Once | INTRAMUSCULAR | Status: DC | PRN

## 2016-12-16 MED ORDER — FENTANYL CITRATE (PF) 100 MCG/2ML IJ SOLN: 100.0000 ug | INTRAMUSCULAR | Status: DC | PRN

## 2016-12-16 MED ORDER — OXYCODONE-ACETAMINOPHEN 5-325 MG PO TABS
2.0000 | ORAL_TABLET | ORAL | Status: DC | PRN
Start: 2016-12-16 — End: 2016-12-18

## 2016-12-16 MED ORDER — MAGNESIUM SULFATE 40 G IN LACTATED RINGERS - SIMPLE
2.0000 g/h | INTRAVENOUS | Status: DC
  Administered 2016-12-17: 2 g/h via INTRAVENOUS
  Filled 2016-12-16: qty 500
  Filled 2016-12-16: qty 40

## 2016-12-16 MED ORDER — SOD CITRATE-CITRIC ACID 500-334 MG/5ML PO SOLN: 30.0000 mL | ORAL | Status: DC | PRN

## 2016-12-16 MED ORDER — LIDOCAINE HCL (PF) 1 % IJ SOLN
30.0000 mL | INTRAMUSCULAR | Status: AC | PRN
  Administered 2016-12-18: 30 mL via SUBCUTANEOUS
  Filled 2016-12-16: qty 30

## 2016-12-16 MED ORDER — OXYTOCIN 40 UNITS IN LACTATED RINGERS INFUSION - SIMPLE MED: 2.5000 [IU]/h | INTRAVENOUS | Status: DC

## 2016-12-16 NOTE — H&P (Signed)
LABOR AND DELIVERY ADMISSION HISTORY AND PHYSICAL NOTE   Kimberly [name] is a 41 y.o. female G1P0000 with IUP at [redacted]w[redacted]d by LMP c/w early U/S presenting for severe-range BPs. Previously diagnosed with gestational hypertension, and BP noted to be severe range in clinic today. Patient denies headache, visual changes, RUQ/epigastric pain, or SOB. She reports positive fetal movement. She denies leakage of fluid or vaginal bleeding.  Prenatal History/Complications: PNC at [facility] Complications - AMA (normal NIPS0 - PIH  Past Medical History: Past Medical History:  Diagnosis Date  . Gallstones   . Pregnancy induced hypertension     Past Surgical History: Past Surgical History:  Procedure Laterality Date  . CHOLECYSTECTOMY      Obstetrical History: OB History    Gravida Para Term Preterm AB Living   1 0 0 0 0 0   SAB TAB Ectopic Multiple Live Births   0 0 0 0        Social History: Social History   Social History  . Marital status: Married    Spouse name: N/A  . Number of children: N/A  . Years of education: N/A   Social History Main Topics  . Smoking status: Never Smoker  . Smokeless tobacco: Never Used  . Alcohol use 0.5 oz/week    1 Standard drinks or equivalent per week  . Drug use: No  . Sexual activity: Yes    Partners: Male    Birth control/ protection: None   Other Topics Concern  . None   Social History Narrative  . None    Family History: Family History  Problem Relation Age of Onset  . Heart disease Mother   . Hypertension Father   . Heart disease Father   . Hypertension Maternal Grandmother     Allergies: No Known Allergies  Prescriptions Prior to Admission  Medication Sig Dispense Refill Last Dose  . Omega-3 Fatty Acids (FISH OIL PO) Take by mouth.   Taking  . Prenatal Vit-Fe Fumarate-FA (PRENATAL VITAMIN PO) Take by mouth.   Taking     Review of Systems  All systems reviewed and negative except as stated in HPI  Physical Exam Blood  pressure (!) 168/114, pulse 69, temperature 98.1 F (36.7 C), temperature source Oral, resp. rate 18, weight 134 lb 12 oz (61.1 kg), last menstrual period 03/26/2016, SpO2 100 %. General appearance: alert, tearful HEENT: [place]/AT. MMM Lungs: clear to auscultation bilaterally, no wheezes or rales Heart: regular rate and rhythm Abdomen: soft, non-tender; bowel sounds normal Extremities: Trace to 1+ BLE edema Skin: warm and dry  Fetal monitoring: baseline rate 120, moderate variability, +acel, no decel Uterine activity: UI    Prenatal labs: ABO, Rh: A/POS/-- (02/06 1411) Antibody: NEG (02/06 1411) Rubella: Non-immune RPR: NON REAC (06/27 0944)  HBsAg: NEGATIVE (02/06 1411)  HIV: NONREACTIVE (06/27 0944)  GBS:   Negative (12/01/16) 2-hr GTT: 71, 154, 115 0 - normal Genetic screening:  Normal NIPS Anatomy US: normal female  Prenatal Transfer Tool  Maternal Diabetes: No Genetic Screening: Normal Maternal Ultrasounds/Referrals: Normal Fetal Ultrasounds or other Referrals:  None Maternal Substance Abuse:  No Significant Maternal Medications:  None Significant Maternal Lab Results: None  Results for orders placed or performed during the hospital encounter of 12/16/16 (from the past 24 hour(s))  Protein / creatinine ratio, urine   Collection Time: 12/16/16  6:34 PM  Result Value Ref Range   Creatinine, Urine 37.00 mg/dL   Total Protein, Urine 12 mg/dL   Protein Creatinine Ratio  0.32 (H) 0.00 - 0.15 mg/mg[Cre]  CBC   Collection Time: 12/16/16  6:37 PM  Result Value Ref Range   WBC 9.6 4.0 - 10.5 K/uL   RBC 3.63 (L) 3.87 - 5.11 MIL/uL   Hemoglobin 12.3 12.0 - 15.0 g/dL   HCT 52.735.9 (L) 78.236.0 - 42.346.0 %   MCV 98.9 78.0 - 100.0 fL   MCH 33.9 26.0 - 34.0 pg   MCHC 34.3 30.0 - 36.0 g/dL   RDW 53.612.5 14.411.5 - 31.515.5 %   Platelets 210 150 - 400 K/uL  Comprehensive metabolic panel   Collection Time: 12/16/16  6:37 PM  Result Value Ref Range   Sodium 135 135 - 145 mmol/L   Potassium 4.1 3.5 -  5.1 mmol/L   Chloride 104 101 - 111 mmol/L   CO2 22 22 - 32 mmol/L   Glucose, Bld 104 (H) 65 - 99 mg/dL   BUN 12 6 - 20 mg/dL   Creatinine, Ser 4.000.59 0.44 - 1.00 mg/dL   Calcium 8.9 8.9 - 86.710.3 mg/dL   Total Protein 7.0 6.5 - 8.1 g/dL   Albumin 3.2 (L) 3.5 - 5.0 g/dL   AST 22 15 - 41 U/L   ALT 15 14 - 54 U/L   Alkaline Phosphatase 89 38 - 126 U/L   Total Bilirubin 0.3 0.3 - 1.2 mg/dL   GFR calc non Af Amer >60 >60 mL/min   GFR calc Af Amer >60 >60 mL/min   Anion gap 9 5 - 15    Patient Active Problem List   Diagnosis Date Noted  . Hypertension in antepartum pregnancy 12/01/2016  . Gestational hypertension without significant proteinuria during pregnancy in third trimester, antepartum 12/01/2016  . Encounter for supervision of normal first pregnancy in third trimester 05/20/2016  . Advanced maternal age, primigravida in third trimester, antepartum 05/20/2016    Assessment: Kimberly [name] is a 41 y.o. G1P0000 at 389w6d here for pre-eclampsia with severe features.  BP severe range in MAU, patient asymptomatic. UPC 0.32. Normal CBC and CMP. Patient initially hesitant about admission and IOL, now agreeable with treating severe pre-eclampsia.  #Pre-E with SF: Start IV magnesium. Labetalol protocol to treat severe-range BPs  #Labor: Plan for IOL. Will likely needs cervical ripening. #Pain: Per patient's request  #FWB: Cat I #ID:  GBS neg #MOF: breast #MOC: vasectomy  #Circ:  N/a (girl)  Kandra NicolasJulie P Degele 12/16/2016, 8:41 PM

## 2016-12-16 NOTE — Progress Notes (Signed)
   PRENATAL VISIT NOTE  Subjective:  Kimberly [name] is a 41 y.o. G1P0000 at 9242w6d being seen today for ongoing prenatal care.  She is currently monitored for the following issues for this high-risk pregnancy and has Encounter for supervision of normal first pregnancy in third trimester; Advanced maternal age, primigravida in third trimester, antepartum; Hypertension in antepartum pregnancy; and Gestational hypertension without significant proteinuria during pregnancy in third trimester, antepartum on her problem list.  Patient reports no complaints.  Contractions: Irritability. Vag. Bleeding: None.  Movement: Present. Denies leaking of fluid.   The following portions of the patient's history were reviewed and updated as appropriate: allergies, current medications, past family history, past medical history, past social history, past surgical history and problem list. Problem list updated.  Objective:   Vitals:   12/16/16 1601  Weight: 233 lb (105.7 kg)    Fetal Status:     Movement: Present     General:  Alert, oriented and cooperative. Patient is in no acute distress.  Skin: Skin is warm and dry. No rash noted.   Cardiovascular: Normal heart rate noted  Respiratory: Normal respiratory effort, no problems with respiration noted  Abdomen: Soft, gravid, appropriate for gestational age.  Pain/Pressure: Present     Pelvic: Cervical exam performed        Extremities: Normal range of motion.  Edema: Mild pitting, slight indentation  Mental Status:  Normal mood and affect. Normal behavior. Normal judgment and thought content.   Assessment and Plan:  Pregnancy: G1P0000 at 7242w6d  1. Pre-existing secondary hypertension during pregnancy, antepartum -  2. Gestational hypertension without significant proteinuria during pregnancy in third trimester, antepartum - to MAU for evaluation of her BPs  3. Advanced maternal age, primigravida in third trimester, antepartum -  Preterm labor symptoms  and general obstetric precautions including but not limited to vaginal bleeding, contractions, leaking of fluid and fetal movement were reviewed in detail with the patient. Please refer to After Visit Summary for other counseling recommendations.  No Follow-up on file.   Kimberly BossierMyra C Stewart Pimenta, MD

## 2016-12-16 NOTE — Anesthesia Pain Management Evaluation Note (Signed)
  CRNA Pain Management Visit Note  Patient: Kimberly [name], 41 y.o., female  "Hello I am a [facility] of the [facility]. We have an anesthesia team available at all times to provide care throughout the hospital, including epidural management and anesthesia for C-section. I don't know your plan for the delivery whether it a natural birth, water birth, IV sedation, nitrous supplementation, doula or epidural, but we want to meet your pain goals."   1.Was your pain managed to your expectations on prior hospitalizations?   No prior hospitalizations  2.What is your expectation for pain management during this hospitalization?     Epidural, IV pain meds and Nitrous Oxide  3.How can we help you reach that goal? Patient is aware of all pain control options. Will determine plan as labor progresses.  Record the patient's initial score and the patient's pain goal.   Pain: 0  Pain Goal: 6 [facility] wants you to be able to say your pain was always managed very well.  Billie Intriago L 12/16/2016

## 2016-12-16 NOTE — MAU Note (Signed)
BP was up, every time they checked it, she would get more nervous and it was going up. Not contracting.  Denies HA, visual changes, epigastric pain. Swelling increases in lower extremiites during the day, goes away with elevation.

## 2016-12-17 ENCOUNTER — Inpatient Hospital Stay (HOSPITAL_COMMUNITY): Payer: PRIVATE HEALTH INSURANCE | Admitting: Anesthesiology

## 2016-12-17 LAB — CBC
HCT: 34.4 % — ABNORMAL LOW (ref 36.0–46.0)
Hemoglobin: 11.7 g/dL — ABNORMAL LOW (ref 12.0–15.0)
MCH: 34 pg (ref 26.0–34.0)
MCHC: 34 g/dL (ref 30.0–36.0)
MCV: 100 fL (ref 78.0–100.0)
Platelets: 202 10*3/uL (ref 150–400)
RBC: 3.44 MIL/uL — ABNORMAL LOW (ref 3.87–5.11)
RDW: 12.8 % (ref 11.5–15.5)
WBC: 10.6 10*3/uL — ABNORMAL HIGH (ref 4.0–10.5)

## 2016-12-17 LAB — RPR: RPR: NONREACTIVE

## 2016-12-17 MED ORDER — EPHEDRINE 5 MG/ML INJ: 10.0000 mg | INTRAVENOUS | Status: DC | PRN

## 2016-12-17 MED ORDER — DIPHENHYDRAMINE HCL 50 MG/ML IJ SOLN: 12.5000 mg | INTRAMUSCULAR | Status: DC | PRN

## 2016-12-17 MED ORDER — FENTANYL 2.5 MCG/ML BUPIVACAINE 1/10 % EPIDURAL INFUSION (WH - ANES)
14.0000 mL/h | INTRAMUSCULAR | Status: DC | PRN
  Administered 2016-12-17 – 2016-12-18 (×2): 14 mL/h via EPIDURAL
  Filled 2016-12-17 (×2): qty 100

## 2016-12-17 MED ORDER — EPHEDRINE 5 MG/ML INJ
10.0000 mg | INTRAVENOUS | Status: DC | PRN
  Filled 2016-12-17: qty 2

## 2016-12-17 MED ORDER — LACTATED RINGERS IV SOLN
500.0000 mL | Freq: Once | INTRAVENOUS | Status: AC
  Administered 2016-12-17: 500 mL via INTRAVENOUS

## 2016-12-17 MED ORDER — FENTANYL 2.5 MCG/ML BUPIVACAINE 1/10 % EPIDURAL INFUSION (WH - ANES): 14.0000 mL/h | INTRAMUSCULAR | Status: DC | PRN

## 2016-12-17 MED ORDER — PHENYLEPHRINE 40 MCG/ML (10ML) SYRINGE FOR IV PUSH (FOR BLOOD PRESSURE SUPPORT): 80.0000 ug | PREFILLED_SYRINGE | INTRAVENOUS | Status: DC | PRN

## 2016-12-17 MED ORDER — TERBUTALINE SULFATE 1 MG/ML IJ SOLN: 0.2500 mg | Freq: Once | INTRAMUSCULAR | Status: DC | PRN

## 2016-12-17 MED ORDER — PHENYLEPHRINE 40 MCG/ML (10ML) SYRINGE FOR IV PUSH (FOR BLOOD PRESSURE SUPPORT)
80.0000 ug | PREFILLED_SYRINGE | INTRAVENOUS | Status: DC | PRN
  Filled 2016-12-17: qty 10
  Filled 2016-12-17: qty 5

## 2016-12-17 MED ORDER — OXYTOCIN 40 UNITS IN LACTATED RINGERS INFUSION - SIMPLE MED
1.0000 m[IU]/min | INTRAVENOUS | Status: DC
  Administered 2016-12-17: 2 m[IU]/min via INTRAVENOUS
  Filled 2016-12-17: qty 1000

## 2016-12-17 MED ORDER — LACTATED RINGERS IV SOLN: 500.0000 mL | Freq: Once | INTRAVENOUS | Status: DC

## 2016-12-17 MED ORDER — PHENYLEPHRINE 40 MCG/ML (10ML) SYRINGE FOR IV PUSH (FOR BLOOD PRESSURE SUPPORT)
80.0000 ug | PREFILLED_SYRINGE | INTRAVENOUS | Status: DC | PRN
  Administered 2016-12-17: 80 ug via INTRAVENOUS
  Filled 2016-12-17: qty 5

## 2016-12-17 MED ORDER — LIDOCAINE HCL (PF) 1 % IJ SOLN
INTRAMUSCULAR | Status: DC | PRN
  Administered 2016-12-17: 6 mL via EPIDURAL
  Administered 2016-12-17: 4 mL

## 2016-12-17 NOTE — Progress Notes (Signed)
Kimberly [name] is a 41 y.o. G1P0000 at 5944w0d admitted for IOL for pre-eclampsia with severe features.  Subjective:   Objective: BP (!) 141/79   Pulse 68   Temp 98.6 F (37 C) (Oral)   Resp 18   Ht 5\' 9"  (1.753 m)   Wt 105.7 kg (233 lb)   LMP 03/26/2016   SpO2 100%   BMI 34.41 kg/m  I/O last 3 completed shifts: In: 4336.8 [P.O.:1484; I.V.:2852.8] Out: 3550 [Urine:3550] No intake/output data recorded.  FHT: baseline 120, min-mod variability, +accels, no decels UC:   Regular q2-3 min SVE:   Dilation: 6 Effacement (%): 100 Station: -1 Exam by:: dr Frances Furbishwinfrey  Labs: Lab Results  Component Value Date   WBC 10.6 (H) 12/17/2016   HGB 11.7 (L) 12/17/2016   HCT 34.4 (L) 12/17/2016   MCV 100.0 12/17/2016   PLT 202 12/17/2016    Assessment / Plan:  G1P0 at 38 weeks 0 days induced for pre-eclampsia with severe features and progressing.  Labor: progressing on pitocin, AROM performed @ 1718 with return of clear fluid, IUPC placed @ 2122.  Will continue to monitor contractions because they seem to be decreasing in strength Preeclampsia:  on magnesium sulfate Fetal Wellbeing:  Category II Pain Control:  Epidural I/D:  GBS negative Anticipated MOD:  NSVD  Kimberly SoldersAmanda C Kashmir Lysaght, DO PGY-2 12/17/2016, 9:37 PM

## 2016-12-17 NOTE — Progress Notes (Signed)
Patient ID: Kimberly [name], female   DOB: 06/04/1975, 41 y.o.   MRN: [id number]  Comfortable w/ epidural; on mag sulfate  BP 116/67, other VSS FHR 125, +accels, no decels Ctx q 2 mins w/ Pit @ 2520mu/min, but amplitude is dampened on ctx waves so MVUs aren't accurate Cx 7+/90/-2  IUP@term  Pre-e w/ severe features, BPs stable now Beginning active labor  Plan to keep ctx reg and check cx in 2 hrs or sooner prn Anticipate SVD Will keep mag sulfate x 24h after delivery  Cam HaiSHAW, KIMBERLY CNM 12/17/2016 11:39 PM

## 2016-12-17 NOTE — Progress Notes (Signed)
Kimberly [name] is a 41 y.o. G1P0000 at 5762w0d admitted for IOL for pre-eclampsia with severe features.  Subjective:   Objective: BP 123/72   Pulse 78   Temp 98 F (36.7 C) (Axillary)   Resp 18   Ht 5\' 9"  (1.753 m)   Wt 105.7 kg (233 lb)   LMP 03/26/2016   SpO2 100%   BMI 34.41 kg/m  I/O last 3 completed shifts: In: 1654.2 [P.O.:300; I.V.:1354.2] Out: 1350 [Urine:1350] Total I/O In: 2430.2 [P.O.:1184; I.V.:1246.2] Out: 2200 [Urine:2200]  FHT: baseline 120, moderate variability, +accels, no decels UC:   Regular q2-3 min SVE:   Dilation: 5 Effacement (%): 70 Station: -2 Exam by::  (Dr Freada BergeronKey)  Labs: Lab Results  Component Value Date   WBC 10.6 (H) 12/17/2016   HGB 11.7 (L) 12/17/2016   HCT 34.4 (L) 12/17/2016   MCV 100.0 12/17/2016   PLT 202 12/17/2016    Assessment / Plan:  G1P0 at 38 weeks 0 days induced for pre-eclampsia with severe features and progressing.  Labor: progressing on pitocin, AROM performed with return of clear fluid Preeclampsia:  on magnesium sulfate Fetal Wellbeing:  Category I Pain Control:  Epidural I/D:  GBS negative Anticipated MOD:  NSVD  Kimberly BeachMary K Zarif Rathje, DO PGY-2 12/17/2016, 6:05 PM

## 2016-12-17 NOTE — Progress Notes (Signed)
Patient ID: Kimberly [name], female   DOB: 12/19/1975, 41 y.o.   MRN: [id number] Discussed option of Cytotec vs Foley  Patient agreed to have Foley inserted  FHR stable UCs irregular, spaced out q6 min  Foley inserted Cervix 1/60/-2  Discussed adding Pitocin, patient wants to wait a while

## 2016-12-17 NOTE — Anesthesia Procedure Notes (Signed)
Epidural Patient location during procedure: OB  Staffing Anesthesiologist: Raesha Coonrod  Preanesthetic Checklist Completed: patient identified, pre-op evaluation, timeout performed, IV checked, risks and benefits discussed and monitors and equipment checked  Epidural Patient position: sitting Prep: DuraPrep Patient monitoring: blood pressure and continuous pulse ox Approach: right paramedian Location: L3-L4 Injection technique: LOR air  Needle:  Needle type: Tuohy  Needle gauge: 17 G Needle insertion depth: 6 cm Catheter type: closed end flexible Catheter size: 19 Gauge Catheter at skin depth: 12 cm Test dose: negative  Assessment Sensory level: T8  Additional Notes    Dosing of Epidural:  1st dose, through catheter .............................................  Xylocaine 40 mg  2nd dose, through catheter, after waiting 3 minutes.........Xylocaine 60 mg    As each dose occurred, patient was free of IV sx; and patient exhibited no evidence of SA injection.  Patient is more comfortable after epidural dosed. Please see RN's note for documentation of vital signs,and FHR which are stable.  Patient reminded not to try to ambulate with numb legs, and that an RN must be present when she attempts to get up.         

## 2016-12-17 NOTE — Anesthesia Preprocedure Evaluation (Signed)
Anesthesia Evaluation  Patient identified by MRN, date of birth, ID band Patient awake    Reviewed: Allergy & Precautions, NPO status , Patient's Chart, lab work & pertinent test results  Airway Mallampati: II  TM Distance: >3 FB Neck ROM: Full    Dental  (+) Dental Advisory Given   Pulmonary neg pulmonary ROS,    Pulmonary exam normal breath sounds clear to auscultation       Cardiovascular hypertension, Normal cardiovascular exam Rhythm:Regular Rate:Normal     Neuro/Psych negative neurological ROS  negative psych ROS   GI/Hepatic negative GI ROS, Neg liver ROS,   Endo/Other  Obesity  Renal/GU negative Renal ROS  negative genitourinary   Musculoskeletal negative musculoskeletal ROS (+)   Abdominal   Peds  Hematology negative hematology ROS (+)   Anesthesia Other Findings   Reproductive/Obstetrics                            Anesthesia Physical Anesthesia Plan  ASA: II  Anesthesia Plan: Epidural   Post-op Pain Management:    Induction:   PONV Risk Score and Plan: Treatment may vary due to age or medical condition  Airway Management Planned: Natural Airway  Additional Equipment:   Intra-op Plan:   Post-operative Plan:   Informed Consent: I have reviewed the patients History and Physical, chart, labs and discussed the procedure including the risks, benefits and alternatives for the proposed anesthesia with the patient or authorized representative who has indicated his/her understanding and acceptance.   Dental advisory given  Plan Discussed with:   Anesthesia Plan Comments: (Labs reviewed. Platelets acceptable, patient not taking any blood thinning medications. Risks and benefits discussed with patient, patient expressed understanding and wished to proceed.)        Anesthesia Quick Evaluation

## 2016-12-18 ENCOUNTER — Encounter (HOSPITAL_COMMUNITY): Payer: Self-pay | Admitting: *Deleted

## 2016-12-18 DIAGNOSIS — O1414 Severe pre-eclampsia complicating childbirth: Secondary | ICD-10-CM

## 2016-12-18 DIAGNOSIS — Z3A37 37 weeks gestation of pregnancy: Secondary | ICD-10-CM

## 2016-12-18 LAB — CBC
HEMATOCRIT: 32.3 % — AB (ref 36.0–46.0)
HEMOGLOBIN: 11.3 g/dL — AB (ref 12.0–15.0)
MCH: 34.5 pg — ABNORMAL HIGH (ref 26.0–34.0)
MCHC: 35 g/dL (ref 30.0–36.0)
MCV: 98.5 fL (ref 78.0–100.0)
Platelets: 217 10*3/uL (ref 150–400)
RBC: 3.28 MIL/uL — ABNORMAL LOW (ref 3.87–5.11)
RDW: 12.5 % (ref 11.5–15.5)
WBC: 16.9 10*3/uL — ABNORMAL HIGH (ref 4.0–10.5)

## 2016-12-18 MED ORDER — DIPHENHYDRAMINE HCL 25 MG PO CAPS: 25.0000 mg | ORAL_CAPSULE | Freq: Four times a day (QID) | ORAL | Status: DC | PRN

## 2016-12-18 MED ORDER — COCONUT OIL OIL: 1.0000 "application " | TOPICAL_OIL | Status: DC | PRN

## 2016-12-18 MED ORDER — LACTATED RINGERS IV SOLN
INTRAVENOUS | Status: DC
  Administered 2016-12-18: 13:00:00 via INTRAVENOUS
  Administered 2016-12-18: 100 mL/h via INTRAVENOUS

## 2016-12-18 MED ORDER — SENNOSIDES-DOCUSATE SODIUM 8.6-50 MG PO TABS
2.0000 | ORAL_TABLET | ORAL | Status: DC
  Filled 2016-12-18: qty 2

## 2016-12-18 MED ORDER — MISOPROSTOL 200 MCG PO TABS
800.0000 ug | ORAL_TABLET | Freq: Once | ORAL | Status: AC
  Administered 2016-12-18: 800 ug via ORAL

## 2016-12-18 MED ORDER — ACETAMINOPHEN 325 MG PO TABS: 650.0000 mg | ORAL_TABLET | ORAL | Status: DC | PRN

## 2016-12-18 MED ORDER — DIBUCAINE 1 % RE OINT: 1.0000 "application " | TOPICAL_OINTMENT | RECTAL | Status: DC | PRN

## 2016-12-18 MED ORDER — ONDANSETRON HCL 4 MG PO TABS: 4.0000 mg | ORAL_TABLET | ORAL | Status: DC | PRN

## 2016-12-18 MED ORDER — WITCH HAZEL-GLYCERIN EX PADS: 1.0000 "application " | MEDICATED_PAD | CUTANEOUS | Status: DC | PRN

## 2016-12-18 MED ORDER — MISOPROSTOL 200 MCG PO TABS
ORAL_TABLET | ORAL | Status: AC
  Filled 2016-12-18: qty 4

## 2016-12-18 MED ORDER — PRENATAL MULTIVITAMIN CH
1.0000 | ORAL_TABLET | Freq: Every day | ORAL | Status: DC
  Administered 2016-12-18 – 2016-12-19 (×2): 1 via ORAL
  Filled 2016-12-18 (×2): qty 1

## 2016-12-18 MED ORDER — ZOLPIDEM TARTRATE 5 MG PO TABS: 5.0000 mg | ORAL_TABLET | Freq: Every evening | ORAL | Status: DC | PRN

## 2016-12-18 MED ORDER — TETANUS-DIPHTH-ACELL PERTUSSIS 5-2.5-18.5 LF-MCG/0.5 IM SUSP: 0.5000 mL | Freq: Once | INTRAMUSCULAR | Status: DC

## 2016-12-18 MED ORDER — IBUPROFEN 600 MG PO TABS
600.0000 mg | ORAL_TABLET | Freq: Four times a day (QID) | ORAL | Status: DC
  Administered 2016-12-18 – 2016-12-19 (×6): 600 mg via ORAL
  Filled 2016-12-18 (×6): qty 1

## 2016-12-18 MED ORDER — OXYCODONE HCL 5 MG PO TABS: 5.0000 mg | ORAL_TABLET | ORAL | Status: DC | PRN

## 2016-12-18 MED ORDER — BENZOCAINE-MENTHOL 20-0.5 % EX AERO
1.0000 "application " | INHALATION_SPRAY | CUTANEOUS | Status: DC | PRN
  Filled 2016-12-18: qty 56

## 2016-12-18 MED ORDER — SIMETHICONE 80 MG PO CHEW: 80.0000 mg | CHEWABLE_TABLET | ORAL | Status: DC | PRN

## 2016-12-18 MED ORDER — ONDANSETRON HCL 4 MG/2ML IJ SOLN: 4.0000 mg | INTRAMUSCULAR | Status: DC | PRN

## 2016-12-18 MED ORDER — MAGNESIUM SULFATE 40 G IN LACTATED RINGERS - SIMPLE
2.0000 g/h | INTRAVENOUS | Status: DC
  Filled 2016-12-18 (×2): qty 500

## 2016-12-18 NOTE — Progress Notes (Signed)
UR chart review completed.  

## 2016-12-18 NOTE — Progress Notes (Signed)
Patient verbally upset regarding baby bath.  NT and RN attempted to get temp within the 2 hour mark around 0800 but mother denied NT and RN on separate occasions to touch the baby because she was sleeping. Patient educated regarding the policy that the baby must have two temp above 98.0 within two hours.  When the NT later attempted to bathe baby, mother was feeding baby and denied bath at this time.  Around 3pm RN advised patient that baby could be bathed later this afternoon/evening.  At 1745 patient upset regarding delay in bath and why she has not seen a doctor today.  RN reviewed with patient the events of the day that led to the delay of the bath.  Also discussed that a provider would round on her in the morning.  Upon nothing being accomplished from conversation, father asked this RN to leave the room.  I offered to have my charge RN speak with the patient and her husband.  Charge RN currently in with patient.

## 2016-12-18 NOTE — Progress Notes (Signed)
Patient having increased swelling in the upper extremities. Patient diuresing adequately. Dr. Jolayne Pantheronstant made aware. No new orders. Will continue to monitor.

## 2016-12-18 NOTE — Plan of Care (Signed)
Problem: Coping: Goal: Ability to cope will improve Outcome: Progressing Patient very anxious regarding care of self and baby.  Emotional support and education given.   Problem: Life Cycle: Goal: Risk for postpartum hemorrhage will decrease Outcome: Progressing Patient's fundus firm U/2 upon assessment.  Lochia minimal at this time.   Problem: Nutritional: Goal: Dietary intake will improve Outcome: Progressing Patient adequately maintaining regular diet.  Goal: Mothers verbalization of comfort with breastfeeding process will improve Lactation has been to see patient.  Patient reports no issues with breastfeeding.   Problem: Role Relationship: Goal: Ability to demonstrate positive interaction with newborn will improve Outcome: Progressing Patient providing supportive care to the newborn.    Problem: Pain Management: Goal: General experience of comfort will improve and pain level will decrease Outcome: Progressing Patient denies pain at this time. Slight discomfort in perineal area with movement.    Problem: Bowel/Gastric: Goal: Gastrointestinal status will improve Outcome: Progressing Bowel sounds active.  Patient states she had a bowel movement this morning.   Problem: Respiratory: Goal: Ability to maintain adequate ventilation will improve Outcome: Progressing Patient maintaining respirations in the range of 18-20.  Lung sounds bilaterally clear.   Problem: Urinary Elimination: Goal: Ability to reestablish a normal urinary elimination pattern will improve Outcome: Progressing Patient diuresing well.  Patient voiding without trouble. Urine clear.

## 2016-12-18 NOTE — Lactation Note (Signed)
This note was copied from a baby's chart. Lactation Consultation Note  Patient Name: Kimberly [name] [id number]'s Date: 12/18/2016 Reason for consult: Initial assessment  Baby 10 hours old. Mom reports that baby has been latching well to left breast, but she has been having some trouble latching to right breast. Baby cueing to nurse, so assisted mom to latch baby to right breast in football position. Baby latched deeply and suckled rhythmically with some swallows noted. Assisted mom with hand expression with colostrum present. Mom had lots of questions regarding nursing, and all were answered. Enc mom to nurse with cues, and offer lots of STS. Enc mom to nurse STS to keep baby nursing and not becoming sleepy. Discussed positioning with pillows for comfort and to achieve a deep latch. Mom move and baby slipped to tip of nipple and started making a clicking sound. Discussed with mom how to keep baby tucked in tight to avoid losing the deep latch. Baby able to be latched more deeply and no more clicking noted. Demonstrated how to support mom's hand with blanket roll as well.  [facility], aware of OP/BFSG and LC phone line assistance after D/C.   Maternal Data Has patient been taught Hand Expression?: Yes Does the patient have breastfeeding experience prior to this delivery?: No  Feeding Feeding Type: Breast Fed  LATCH Score Latch: Grasps breast easily, tongue down, lips flanged, rhythmical sucking.  Audible Swallowing: A few with stimulation  Type of Nipple: Everted at rest and after stimulation  Comfort (Breast/Nipple): Soft / non-tender  Hold (Positioning): Assistance needed to correctly position infant at breast and maintain latch.  LATCH Score: 8  Interventions Interventions: Breast feeding basics reviewed;Assisted with latch;Skin to skin;Hand express;Breast compression;Adjust position;Support pillows;Position options  Lactation Tools Discussed/Used     Consult  Status Consult Status: Follow-up Date: 12/19/16 Follow-up type: In-patient    [name] 12/18/2016, 12:37 PM

## 2016-12-18 NOTE — Anesthesia Postprocedure Evaluation (Signed)
Anesthesia Post Note  Patient: Kimberly [name]  Procedure(s) Performed: * No procedures listed *     Patient location during evaluation: Mother Baby Anesthesia Type: Epidural Level of consciousness: awake and alert and oriented Pain management: satisfactory to patient Vital Signs Assessment: post-procedure vital signs reviewed and stable Respiratory status: spontaneous breathing and nonlabored ventilation Cardiovascular status: stable Postop Assessment: no headache, no backache, no signs of nausea or vomiting, adequate PO intake and patient able to bend at knees (patient up walking) Anesthetic complications: no    Last Vitals:  Vitals:   12/18/16 0500 12/18/16 0539  BP:  (!) 150/93  Pulse:  81  Resp: 18 18  Temp:  37.2 C  SpO2:  100%    Last Pain:  Vitals:   12/18/16 0700  TempSrc:   PainSc: 0-No pain   Pain Goal:                 Edwardine Deschepper     

## 2016-12-18 NOTE — Progress Notes (Signed)
MOB was referred for history of depression/anxiety.  CSW reviewed MOB's medical record and does not see any documentation of hx or current mental health diagnoses.   Please contact the Clinical Social Worker if needs arise, [facility], or if MOB scores 9 or greater/yes to question 10 on Edinburgh Postpartum Depression Screen.

## 2016-12-18 NOTE — Anesthesia Postprocedure Evaluation (Signed)
Anesthesia Post Note  Patient: Gabriel CarinaJessica L Finelli  Procedure(s) Performed: * No procedures listed *     Patient location during evaluation: Mother Baby Anesthesia Type: Epidural Level of consciousness: awake and alert and oriented Pain management: satisfactory to patient Vital Signs Assessment: post-procedure vital signs reviewed and stable Respiratory status: spontaneous breathing and nonlabored ventilation Cardiovascular status: stable Postop Assessment: no headache, no backache, no signs of nausea or vomiting, adequate PO intake and patient able to bend at knees (patient up walking) Anesthetic complications: no    Last Vitals:  Vitals:   12/18/16 0500 12/18/16 0539  BP:  (!) 150/93  Pulse:  81  Resp: 18 18  Temp:  37.2 C  SpO2:  100%    Last Pain:  Vitals:   12/18/16 0700  TempSrc:   PainSc: 0-No pain   Pain Goal:                 Edia Pursifull

## 2016-12-19 ENCOUNTER — Encounter (HOSPITAL_COMMUNITY): Payer: Self-pay

## 2016-12-19 ENCOUNTER — Encounter: Payer: PRIVATE HEALTH INSURANCE | Admitting: *Deleted

## 2016-12-19 MED ORDER — AMLODIPINE BESYLATE 5 MG PO TABS: 5.0000 mg | ORAL_TABLET | Freq: Every day | ORAL | 1 refills | Status: DC

## 2016-12-19 MED ORDER — IBUPROFEN 600 MG PO TABS: 600.0000 mg | ORAL_TABLET | Freq: Four times a day (QID) | ORAL | 0 refills | Status: DC

## 2016-12-19 MED ORDER — AMLODIPINE BESYLATE 5 MG PO TABS
5.0000 mg | ORAL_TABLET | Freq: Every day | ORAL | Status: DC
  Administered 2016-12-19: 5 mg via ORAL
  Filled 2016-12-19: qty 1

## 2016-12-19 NOTE — Discharge Summary (Signed)
OB Discharge Summary     Patient Name: Kimberly [name] DOB: Sep 14, 1975 MRN: [id number]  Date of admission: 12/16/2016 Delivering MD: Lezlie Octave C   Date of discharge: 12/19/2016  Admitting diagnosis: 38WKS HIGH BLOOD PRESSURE Intrauterine pregnancy: [redacted]w[redacted]d     Secondary diagnosis:  Principal Problem:   NSVD (normal spontaneous vaginal delivery) Active Problems:   Severe pre-eclampsia  Additional problems:  Patient Active Problem List   Diagnosis Date Noted  . NSVD (normal spontaneous vaginal delivery) 12/17/2016  . Severe pre-eclampsia 12/01/2016  . Encounter for supervision of normal first pregnancy in third trimester 05/20/2016  . Advanced maternal age, primigravida in third trimester, antepartum 05/20/2016       Discharge diagnosis: Term Pregnancy Delivered and Preeclampsia (severe)                                                                                                Post partum procedures:none  Augmentation: AROM, Pitocin, Cytotec and Foley Balloon  Complications: None  Hospital course:  Induction of Labor With Vaginal Delivery   41 y.o. yo G1P1001 at [redacted]w[redacted]d was admitted to the hospital 12/16/2016 for induction of labor.  Indication for induction: Preeclampsia with severe features. She was started on IV Magenesium and IOL started with cytotec and FB. Labor also augmented with Pitocin and AROM.  Patient had an uncomplicated labor course as follows: Membrane Rupture Time/Date: 5:39 PM ,12/17/2016   Intrapartum Procedures: Episiotomy: None [1]                                         Lacerations:  2nd degree [3];Perineal [11]  Patient had delivery of a Viable infant.  Information for the patient's newborn:  Laddie, Naeem [[id number]]  Delivery Method: Vag-Spont   12/18/2016  Details of delivery can be found in separate delivery note. Patient remained on IV Magnesium 24 hours postpartum. Amlodipine was started for BP control. On 12/19/16, Bps were 140s/70s-80s, and  she was discharged on amlodipine 5 mg daily. She was stable at discharge. Patient is discharged home 12/19/16, to follow up for BP check in 3 days.  Physical exam  Vitals:   12/19/16 0654 12/19/16 1218 12/19/16 1237 12/19/16 1633  BP: (!) 148/82 (!) 161/81 (!) 141/79 (!) 141/79  Pulse: 63 74 78 84  Resp: Temp: 97.9 F (36.6 C) 99.4 F (37.4 C)  98.6 F (37 C)  TempSrc: Oral Oral    SpO2: 98% 97%  100%  Weight:      Height:       General: alert, cooperative and no distress Lochia: appropriate Uterine Fundus: firm Incision: N/A DVT Evaluation: No evidence of DVT seen on physical exam. Labs: Lab Results  Component Value Date   WBC 16.9 (H) 12/18/2016   HGB 11.3 (L) 12/18/2016   HCT 32.3 (L) 12/18/2016   MCV 98.5 12/18/2016   PLT 217 12/18/2016   CMP Latest Ref Rng & Units 12/16/2016  Glucose 65 - 99 mg/dL 782(N)  BUN 6 - 20 mg/dL 12  Creatinine 9.600.44 - 4.541.00 mg/dL 0.980.59  Sodium 119135 - 147145 mmol/L 135  Potassium 3.5 - 5.1 mmol/L 4.1  Chloride 101 - 111 mmol/L 104  CO2 22 - 32 mmol/L 22  Calcium 8.9 - 10.3 mg/dL 8.9  Total Protein 6.5 - 8.1 g/dL 7.0  Total Bilirubin 0.3 - 1.2 mg/dL 0.3  Alkaline Phos 38 - 126 U/L 89  AST 15 - 41 U/L 22  ALT 14 - 54 U/L 15    Discharge instruction: per After Visit Summary and "Baby and Me Booklet".  After visit meds:  Allergies as of 12/19/2016   No Known Allergies     Medication List    TAKE these medications   amLODipine 5 MG tablet Commonly known as:  NORVASC Take 1 tablet (5 mg total) by mouth daily.   Fish Oil 500 MG Caps Take 1 capsule by mouth daily.   ibuprofen 600 MG tablet Commonly known as:  ADVIL,MOTRIN Take 1 tablet (600 mg total) by mouth every 6 (six) hours.   PRENATAL VITAMIN PO Take 1 tablet by mouth daily.            Discharge Care Instructions        Start     Ordered   12/20/16 0000  amLODipine (NORVASC) 5 MG tablet  Daily     12/19/16 1639   12/19/16 0000  ibuprofen (ADVIL,MOTRIN) 600  MG tablet  Every 6 hours     12/19/16 1639   12/19/16 0000  Call MD for:  temperature >100.4     12/19/16 1639   12/19/16 0000  Call MD for:  persistant nausea and vomiting     12/19/16 1639   12/19/16 0000  Call MD for:  severe uncontrolled pain     12/19/16 1639   12/19/16 0000  Call MD for:  difficulty breathing, headache or visual disturbances     12/19/16 1639   12/19/16 0000  Call MD for:  persistant dizziness or light-headedness     12/19/16 1639   12/19/16 0000  Sexual acrtivity    Comments:  No sexual activity or anything in the vagina for 6 weeks   12/19/16 1639   12/19/16 0000  Diet - low sodium heart healthy     12/19/16 1639   12/17/16 0000  OB RESULT CONSOLE Group B Strep    Comments:  This external order was created through the Results Console.   12/17/16 0927   12/17/16 0000  OB RESULTS CONSOLE GC/Chlamydia    Comments:  This external order was created through the Results Console.   12/17/16 1931      Diet: routine diet  Activity: Advance as tolerated. Pelvic rest for 6 weeks.   Outpatient follow up:  --3-days for BP check --4-6 weeks for postpartum visit  Postpartum contraception: Vasectomy  Newborn Data: Live born female  Birth Weight: 7 lb 3.7 oz (3280 g) APGAR: 9, 9  Baby Feeding: Breast Disposition: baby to be discharged in the morning (staying an extra night d/t mild jaundice)  12/19/2016 Frederik PearJulie P Degele, MD

## 2016-12-19 NOTE — Lactation Note (Signed)
This note was copied from a baby's chart. Lactation Consultation Note  Patient Name: Girl Cathi RoanJessica Lorman [id number]'s Date: 12/19/2016 Reason for consult: Follow-up assessment;Primapara;1st time breastfeeding;Early term 7137-38.6wks  Visited with 1st time Mom, baby 4639 hrs old.  Mom states baby had been cluster feeding all morning, crying a lot and slept a little this afternoon. Mom appears somewhat anxious, asking a lot of questions.  Mom wanting to tell LC entire history since baby has been born.  Offered to assist and assess baby breastfeeding while we talk. Mom denies breast changes in early pregnancy. Hand expression and breast massage demonstrated.  Unable to express any colostrum.  Baby positioned in football hold.  Mom guided to support her breast further back before latching baby.  Baby able to open widely and latch deeply.  Pillows added for support.  Mom encouraged to relax her shoulders and arms.  Encouraged alternate breast compression rather than jigglling the breast to avoid pulling breast out of baby's mouth. Also needed to remind Mom a few times to hold baby in closer, rolled blanket behind hand. LC stayed in and assisted and observed baby on the breast for 20 mins each breast.   Baby showed some evidence of occasional swallows in jaw extensions, but unable to hear any swallows on either breast.  Talked with parents about baby's high intermediate bilirubin level, 7% weight loss in first 24 hrs, and baby acting frantically hungry after 40 mins at the breast. LC recommended post breastfeeding pumping to stimulate her milk supply.  Talked about supplementing baby if she continues to act frantic post breast feeding. Mom and FOB agreed to offering supplement. Demonstrated cup feeding to father, and return demonstration done.  Baby took 8 ml Alimentum well.  Baby resting quietly in crib when LC left.  Mom pumped, didn't obtain any colostrum yet.  Reassured her that her prolactin hormone was  stimulated with the pumping.  Mom appeared more relaxed after baby took supplement.    Recommended - 1- Breast feed STS with a deep, wide latch (showed FOB how to untuck lower lip with chin tug)  Offer both breasts at each feeding 2- Pump both breasts on initiation setting 3- Cup feed 10 ml of EBM+/Alimentum after breastfeeding.  Feeding Feeding Type: Formula Length of feed: 40 min (both breasts)  LATCH Score Latch: Grasps breast easily, tongue down, lips flanged, rhythmical sucking.  Audible Swallowing: A few with stimulation  Type of Nipple: Everted at rest and after stimulation  Comfort (Breast/Nipple): Filling, red/small blisters or bruises, mild/mod discomfort  Hold (Positioning): Assistance needed to correctly position infant at breast and maintain latch.  LATCH Score: 7  Interventions Interventions: Breast feeding basics reviewed;Assisted with latch;Skin to skin;Breast massage;Hand express;Breast compression;Adjust position;Support pillows;Position options;DEBP  Lactation Tools Discussed/Used Tools: Pump Breast pump type: Double-Electric Breast Pump WIC Program: No Pump Review: Setup, frequency, and cleaning;Milk Storage Initiated by:: Johny Blameraroline Benzion Mesta RN IBCLC Date initiated:: 12/19/16   Consult Status Consult Status: Follow-up Date: 12/19/16 Follow-up type: In-patient    Judee ClaraSmith, Cincere Zorn E 12/19/2016, 6:39 PM

## 2016-12-19 NOTE — Lactation Note (Signed)
This note was copied from a baby's chart. Lactation Consultation Note  Patient Name: Girl Cathi RoanJessica Poncedeleon [id number]'s Date: 12/19/2016   Attempted to visit this afternoon, baby 2336 hrs old.  Sign on door to not disturb.  Spoke to nurse.  Mom exhausted, baby quiet and both sleeping.  Asked me to come back, has LC extension number to call for next feeding.   Judee ClaraSmith, Quentina Fronek E 12/19/2016, 2:31 PM

## 2016-12-19 NOTE — Progress Notes (Signed)
I received a consult due to pt stress and anxiety.  I offered support and affirmation as well as prayer, at patient's request.  We talked about the risks for PAMD when things don't go according to plan.  She stated that she knows the risks and is not afraid to reach out for support.  She was appreciative of the support and the prayer.    Chaplain Dyanne CarrelKaty Leandra Vanderweele, Bcc Pager, [phone] 3:09 PM    12/19/16 1500  Clinical Encounter Type  Visited With Patient and family together  Visit Type Spiritual support  Referral From Nurse  Spiritual Encounters  Spiritual Needs Emotional

## 2016-12-19 NOTE — Lactation Note (Signed)
This note was copied from a baby's chart. Lactation Consultation Note Mom tearful in bed with pediatrician at bedside; discussing infant with someone on speaker phone.  Visitor holding infant.  LC remained at bedside for a few minutes.  Will check back in a few minutes.  Patient Name: Kimberly [name] [id number]'s Date: 12/19/2016     Maternal Data    Feeding Feeding Type: Breast Fed Length of feed: 10 min  LATCH Score                   Interventions    Lactation Tools Discussed/Used     Consult Status      Maryruth HancockKelly Suzanne Ba[facility] HospitalBlack 12/19/2016, 12:08 PM

## 2016-12-19 NOTE — Discharge Instructions (Signed)
Home Care Instructions for Mom °ACTIVITY °· Gradually return to your regular activities. °· Let yourself rest. Nap while your baby sleeps. °· Avoid lifting anything that is heavier than 10 lb (4.5 kg) until your health care provider says it is okay. °· Avoid activities that take a lot of effort and energy (are strenuous) until approved by your health care provider. Walking at a slow-to-moderate pace is usually safe. °· If you had a cesarean delivery: °? Do not vacuum, climb stairs, or drive a car for 4-6 weeks. °? Have someone help you at home until you feel like you can do your usual activities yourself. °? Do exercises as told by your health care provider, if this applies. ° °VAGINAL BLEEDING °You may continue to bleed for 4-6 weeks after delivery. Over time, the amount of blood usually decreases and the color of the blood usually gets lighter. However, the flow of bright red blood may increase if you have been too active. If you need to use more than one pad in an hour because your pad gets soaked, or if you pass a large clot: °· Lie down. °· Raise your feet. °· Place a cold compress on your lower abdomen. °· Rest. °· Call your health care provider. ° °If you are breastfeeding, your period should return anytime between 8 weeks after delivery and the time that you stop breastfeeding. If you are not breastfeeding, your period should return 6-8 weeks after delivery. °PERINEAL CARE °The perineal area, or perineum, is the part of your body between your thighs. After delivery, this area needs special care. Follow these instructions as told by your health care provider. °· Take warm tub baths for 15-20 minutes. °· Use medicated pads and pain-relieving sprays and creams as told. °· Do not use tampons or douches until vaginal bleeding has stopped. °· Each time you go to the bathroom: °? Use a peri bottle. °? Change your pad. °? Use towelettes in place of toilet paper until your stitches have healed. °· Do Kegel exercises  every day. Kegel exercises help to maintain the muscles that support the vagina, bladder, and bowels. You can do these exercises while you are standing, sitting, or lying down. To do Kegel exercises: °? Tighten the muscles of your abdomen and the muscles that surround your birth canal. °? Hold for a few seconds. °? Relax. °? Repeat until you have done this 5 times in a row. °· To prevent hemorrhoids from developing or getting worse: °? Drink enough fluid to keep your urine clear or pale yellow. °? Avoid straining when having a bowel movement. °? Take over-the-counter medicines and stool softeners as told by your health care provider. ° °BREAST CARE °· Wear a tight-fitting bra. °· Avoid taking over-the-counter pain medicine for breast discomfort. °· Apply ice to the breasts to help with discomfort as needed: °? Put ice in a plastic bag. °? Place a towel between your skin and the bag. °? Leave the ice on for 20 minutes or as told by your health care provider. ° °NUTRITION °· Eat a well-balanced diet. °· Do not try to lose weight quickly by cutting back on calories. °· Take your prenatal vitamins until your postpartum checkup or until your health care provider tells you to stop. ° °POSTPARTUM DEPRESSION °You may find yourself crying for no apparent reason and unable to cope with all of the changes that come with having a newborn. This mood is called postpartum depression. Postpartum depression happens because your hormone   levels change after delivery. If you have postpartum depression, get support from your partner, friends, and family. If the depression does not go away on its own after several weeks, contact your health care provider. BREAST SELF-EXAM Do a breast self-exam each month, at the same time of the month. If you are breastfeeding, check your breasts just after a feeding, when your breasts are less full. If you are breastfeeding and your period has started, check your breasts on day 5, 6, or 7 of your  period. Report any lumps, bumps, or discharge to your health care provider. Know that breasts are normally lumpy if you are breastfeeding. This is temporary, and it is not a health risk. INTIMACY AND SEXUALITY Avoid sexual activity for at least 3-4 weeks after delivery or until the brownish-red vaginal flow is completely gone. If you want to avoid pregnancy, use some form of birth control. You can get pregnant after delivery, even if you have not had your period. SEEK MEDICAL CARE IF:  You feel unable to cope with the changes that a child brings to your life, and these feelings do not go away after several weeks.  You notice a lump, a bump, or discharge on your breast.  SEEK IMMEDIATE MEDICAL CARE IF:  Blood soaks your pad in 1 hour or less.  You have: ? Severe pain or cramping in your lower abdomen. ? A bad-smelling vaginal discharge. ? A fever that is not controlled by medicine. ? A fever, and an area of your breast is red and sore. ? Pain or redness in your calf. ? Sudden, severe chest pain. ? Shortness of breath. ? Painful or bloody urination. ? Problems with your vision.  You vomit for 12 hours or longer.  You develop a severe headache.  You have serious thoughts about hurting yourself, your child, or anyone else.  This information is not intended to replace advice given to you by your health care provider. Make sure you discuss any questions you have with your health care provider. Document Released: 03/28/2000 Document Revised: 09/06/2015 Document Reviewed: 10/02/2014 Elsevier Interactive Patient Education  2017 [employer]   Breastfeeding Deciding to breastfeed is one of the best choices you can make for you and your baby. A change in hormones during pregnancy causes your breast tissue to grow and increases the number and size of your milk ducts. These hormones also allow proteins, sugars, and fats from your blood supply to make breast milk in your milk-producing  glands. Hormones prevent breast milk from being released before your baby is born as well as prompt milk flow after birth. Once breastfeeding has begun, thoughts of your baby, as well as his or her sucking or crying, can stimulate the release of milk from your milk-producing glands. Benefits of breastfeeding For Your Baby  Your first milk (colostrum) helps your baby's digestive system function better.  There are antibodies in your milk that help your baby fight off infections.  Your baby has a lower incidence of asthma, allergies, and sudden infant death syndrome.  The nutrients in breast milk are better for your baby than infant formulas and are designed uniquely for your babys needs.  Breast milk improves your baby's brain development.  Your baby is less likely to develop other conditions, such as childhood obesity, asthma, or type 2 diabetes mellitus.  For You  Breastfeeding helps to create a very special bond between you and your baby.  Breastfeeding is convenient. Breast milk is always available at the  correct temperature and costs nothing.  Breastfeeding helps to burn calories and helps you lose the weight gained during pregnancy.  Breastfeeding makes your uterus contract to its prepregnancy size faster and slows bleeding (lochia) after you give birth.  Breastfeeding helps to lower your risk of developing type 2 diabetes mellitus, osteoporosis, and breast or ovarian cancer later in life.  Signs that your baby is hungry Early Signs of Hunger  Increased alertness or activity.  Stretching.  Movement of the head from side to side.  Movement of the head and opening of the mouth when the corner of the mouth or cheek is stroked (rooting).  Increased sucking sounds, smacking lips, cooing, sighing, or squeaking.  Hand-to-mouth movements.  Increased sucking of fingers or hands.  Late Signs of Hunger  Fussing.  Intermittent crying.  Extreme Signs of Hunger Signs of  extreme hunger will require calming and consoling before your baby will be able to breastfeed successfully. Do not wait for the following signs of extreme hunger to occur before you initiate breastfeeding:  Restlessness.  A loud, strong cry.  Screaming.  Breastfeeding basics Breastfeeding Initiation  Find a comfortable place to sit or lie down, with your neck and back well supported.  Place a pillow or rolled up blanket under your baby to bring him or her to the level of your breast (if you are seated). Nursing pillows are specially designed to help support your arms and your baby while you breastfeed.  Make sure that your baby's abdomen is facing your abdomen.  Gently massage your breast. With your fingertips, massage from your chest wall toward your nipple in a circular motion. This encourages milk flow. You may need to continue this action during the feeding if your milk flows slowly.  Support your breast with 4 fingers underneath and your thumb above your nipple. Make sure your fingers are well away from your nipple and your babys mouth.  Stroke your baby's lips gently with your finger or nipple.  When your baby's mouth is open wide enough, quickly bring your baby to your breast, placing your entire nipple and as much of the colored area around your nipple (areola) as possible into your baby's mouth. ? More areola should be visible above your baby's upper lip than below the lower lip. ? Your baby's tongue should be between his or her lower gum and your breast.  Ensure that your baby's mouth is correctly positioned around your nipple (latched). Your baby's lips should create a seal on your breast and be turned out (everted).  It is common for your baby to suck about 2-3 minutes in order to start the flow of breast milk.  Latching Teaching your baby how to latch on to your breast properly is very important. An improper latch can cause nipple pain and decreased milk supply for you  and poor weight gain in your baby. Also, if your baby is not latched onto your nipple properly, he or she may swallow some air during feeding. This can make your baby fussy. Burping your baby when you switch breasts during the feeding can help to get rid of the air. However, teaching your baby to latch on properly is still the best way to prevent fussiness from swallowing air while breastfeeding. Signs that your baby has successfully latched on to your nipple:  Silent tugging or silent sucking, without causing you pain.  Swallowing heard between every 3-4 sucks.  Muscle movement above and in front of his or her ears  while sucking.  Signs that your baby has not successfully latched on to nipple:  Sucking sounds or smacking sounds from your baby while breastfeeding.  Nipple pain.  If you think your baby has not latched on correctly, slip your finger into the corner of your babys mouth to break the suction and place it between your baby's gums. Attempt breastfeeding initiation again. Signs of Successful Breastfeeding Signs from your baby:  A gradual decrease in the number of sucks or complete cessation of sucking.  Falling asleep.  Relaxation of his or her body.  Retention of a small amount of milk in his or her mouth.  Letting go of your breast by himself or herself.  Signs from you:  Breasts that have increased in firmness, weight, and size 1-3 hours after feeding.  Breasts that are softer immediately after breastfeeding.  Increased milk volume, as well as a change in milk consistency and color by the fifth day of breastfeeding.  Nipples that are not sore, cracked, or bleeding.  Signs That Your Pecola LeisureBaby is Getting Enough Milk  Wetting at least 1-2 diapers during the first 24 hours after birth.  Wetting at least 5-6 diapers every 24 hours for the first week after birth. The urine should be clear or pale yellow by 5 days after birth.  Wetting 6-8 diapers every 24 hours as your  baby continues to grow and develop.  At least 3 stools in a 24-hour period by age 31 days. The stool should be soft and yellow.  At least 3 stools in a 24-hour period by age 67 days. The stool should be seedy and yellow.  No loss of weight greater than 10% of birth weight during the first 1053 days of age.  Average weight gain of 4-7 ounces (113-198 g) per week after age 4 days.  Consistent daily weight gain by age 31 days, without weight loss after the age of 2 weeks.  After a feeding, your baby may spit up a small amount. This is common. Breastfeeding frequency and duration Frequent feeding will help you make more milk and can prevent sore nipples and breast engorgement. Breastfeed when you feel the need to reduce the fullness of your breasts or when your baby shows signs of hunger. This is called "breastfeeding on demand." Avoid introducing a pacifier to your baby while you are working to establish breastfeeding (the first 4-6 weeks after your baby is born). After this time you may choose to use a pacifier. Research has shown that pacifier use during the first year of a baby's life decreases the risk of sudden infant death syndrome (SIDS). Allow your baby to feed on each breast as long as he or she wants. Breastfeed until your baby is finished feeding. When your baby unlatches or falls asleep while feeding from the first breast, offer the second breast. Because newborns are often sleepy in the first few weeks of life, you may need to awaken your baby to get him or her to feed. Breastfeeding times will vary from baby to baby. However, the following rules can serve as a guide to help you ensure that your baby is properly fed:  Newborns (babies 334 weeks of age or younger) may breastfeed every 1-3 hours.  Newborns should not go longer than 3 hours during the day or 5 hours during the night without breastfeeding.  You should breastfeed your baby a minimum of 8 times in a 24-hour period until you begin  to introduce solid foods to your  baby at around 6 months of age. ° °Breast milk pumping °Pumping and storing breast milk allows you to ensure that your baby is exclusively fed your breast milk, even at times when you are unable to breastfeed. This is especially important if you are going back to work while you are still breastfeeding or when you are not able to be present during feedings. Your lactation consultant can give you guidelines on how long it is safe to store breast milk. °A breast pump is a machine that allows you to pump milk from your breast into a sterile bottle. The pumped breast milk can then be stored in a refrigerator or freezer. Some breast pumps are operated by hand, while others use electricity. Ask your lactation consultant which type will work best for you. Breast pumps can be purchased, but some hospitals and breastfeeding support groups lease breast pumps on a monthly basis. A lactation consultant can teach you how to hand express breast milk, if you prefer not to use a pump. °Caring for your breasts while you breastfeed °Nipples can become dry, cracked, and sore while breastfeeding. The following recommendations can help keep your breasts moisturized and healthy: °· Avoid using soap on your nipples. °· Wear a supportive bra. Although not required, special nursing bras and tank tops are designed to allow access to your breasts for breastfeeding without taking off your entire bra or top. Avoid wearing underwire-style bras or extremely tight bras. °· Air dry your nipples for 3-4 minutes after each feeding. °· Use only cotton bra pads to absorb leaked breast milk. Leaking of breast milk between feedings is normal. °· Use lanolin on your nipples after breastfeeding. Lanolin helps to maintain your skin's normal moisture barrier. If you use pure lanolin, you do not need to wash it off before feeding your baby again. Pure lanolin is not toxic to your baby. You may also hand express a few drops of  breast milk and gently massage that milk into your nipples and allow the milk to air dry. ° °In the first few weeks after giving birth, some women experience extremely full breasts (engorgement). Engorgement can make your breasts feel heavy, warm, and tender to the touch. Engorgement peaks within 3-5 days after you give birth. The following recommendations can help ease engorgement: °· Completely empty your breasts while breastfeeding or pumping. You may want to start by applying warm, moist heat (in the shower or with warm water-soaked hand towels) just before feeding or pumping. This increases circulation and helps the milk flow. If your baby does not completely empty your breasts while breastfeeding, pump any extra milk after he or she is finished. °· Wear a snug bra (nursing or regular) or tank top for 1-2 days to signal your body to slightly decrease milk production. °· Apply ice packs to your breasts, unless this is too uncomfortable for you. °· Make sure that your baby is latched on and positioned properly while breastfeeding. ° °If engorgement persists after 48 hours of following these recommendations, contact your health care provider or a lactation consultant. °Overall health care recommendations while breastfeeding °· Eat healthy foods. Alternate between meals and snacks, eating 3 of each per day. Because what you eat affects your breast milk, some of the foods may make your baby more irritable than usual. Avoid eating these foods if you are sure that they are negatively affecting your baby. °· Drink milk, fruit juice, and water to satisfy your thirst (about 10 glasses a day). °· Rest   often, relax, and continue to take your prenatal vitamins to prevent fatigue, stress, and anemia.  Continue breast self-awareness checks.  Avoid chewing and smoking tobacco. Chemicals from cigarettes that pass into breast milk and exposure to secondhand smoke may harm your baby.  Avoid alcohol and drug use, including  marijuana. Some medicines that may be harmful to your baby can pass through breast milk. It is important to ask your health care provider before taking any medicine, including all over-the-counter and prescription medicine as well as vitamin and herbal supplements. It is possible to become pregnant while breastfeeding. If birth control is desired, ask your health care provider about options that will be safe for your baby. Contact a health care provider if:  You feel like you want to stop breastfeeding or have become frustrated with breastfeeding.  You have painful breasts or nipples.  Your nipples are cracked or bleeding.  Your breasts are red, tender, or warm.  You have a swollen area on either breast.  You have a fever or chills.  You have nausea or vomiting.  You have drainage other than breast milk from your nipples.  Your breasts do not become full before feedings by the fifth day after you give birth.  You feel sad and depressed.  Your baby is too sleepy to eat well.  Your baby is having trouble sleeping.  Your baby is wetting less than 3 diapers in a 24-hour period.  Your baby has less than 3 stools in a 24-hour period.  Your baby's skin or the white part of his or her eyes becomes yellow.  Your baby is not gaining weight by 54 days of age. Get help right away if:  Your baby is overly tired (lethargic) and does not want to wake up and feed.  Your baby develops an unexplained fever. This information is not intended to replace advice given to you by your health care provider. Make sure you discuss any questions you have with your health care provider. Document Released: 03/31/2005 Document Revised: 09/12/2015 Document Reviewed: 09/22/2012 Elsevier Interactive Patient Education  2017 [employer].

## 2016-12-19 NOTE — Progress Notes (Signed)
Post Partum Day 1 Subjective: Patient is doing well. She denies HA, visual changes, RUQ/epigastric pain. Patient is eager to go home  Objective: Blood pressure (!) 148/82, pulse 63, temperature 97.9 F (36.6 C), temperature source Oral, resp. rate 18, height 5\' 9"  (1.753 m), weight 233 lb (105.7 kg), last menstrual period 03/26/2016, SpO2 98 %, unknown if currently breastfeeding.  Physical Exam:  General: alert, cooperative and no distress Lochia: appropriate Uterine Fundus: firm DVT Evaluation: No evidence of DVT seen on physical exam. Bilateral lower extremity edema Negative Homan's sign.   Recent Labs  12/17/16 1242 12/18/16 0405  HGB 11.7* 11.3*  HCT 34.4* 32.3*    Assessment/Plan: Patient s/p magnesium sulfate Norvasc started this morning Continue breastfeeding Discharge planning later this afternoon   LOS: 3 days   Kimberly [name] 12/19/2016, 8:09 AM

## 2016-12-19 NOTE — Progress Notes (Signed)
Patient discharged, but stayed in room with infant as baby pt. Discharge teaching, prescriptions, PIH, follow-up appts, and home care reviewed. Reasons to seek care reviewed. No questions from pt at this time.

## 2016-12-20 ENCOUNTER — Ambulatory Visit: Payer: Self-pay

## 2016-12-20 NOTE — Lactation Note (Signed)
This note was copied from a baby's chart. Lactation Consultation Note Checked back in with P1 mom, tearful.  Infant rooting to nurse.  Assisted mom is massage then hand expression, one drop of colostrum seen on right nipple so infant placed in football hold to latch.  Mom was able to latch infant with assistance in sandwiching breast tissue.  Infant had wide gape with bottom lip flanged.  LC highly encouraged breast massage and compression in order to help stimulate swallows; none heard at this feed.  Alternate breast massage used to attempt to hand express but no drops seen at nipple.  Mom was encouraged to relax while feeding.  LC explained that relaxing will help the breastfeeding.  LC listened to mom talk about her sleep deprivation.  Infant continued to nurse.  Mom said she was not able to have any time to pump and was not able to work on hand expressing.  She appears exhausted but encouraged that baby nursed for 10 minutes then self detached and was asleep STS.  Mom appears to have relaxed and LC encouraged mom to take a nap; mat. Grandmother at bedside.  LC explained to mom that hand expressing and use of the DEBP would be beneficial in helping to supplement infant with her breastmilk after feeds.  Mom was encouraged to rest and that another LC would be back to assist mom with plan.  LC leaving notified afternoon LC staff about couplet and that couple needed to be seen again.     Patient Name: Kimberly Cathi RoanJessica Helbing ZOXWR'UToday's Date: [name]     Maternal Data    Feeding    LATCH Score                   Interventions    Lactation Tools Discussed/Used     Consult Status      Kimberly [name], Kimberly [name]

## 2016-12-20 NOTE — Lactation Note (Addendum)
This note was copied from a baby's chart. Lactation Consultation Note  Patient Name: Girl Cathi RoanJessica Eversley ZOXWR'UToday's Date: 12/20/2016 Reason for consult: Follow-up assessment;Hyperbilirubinemia  Baby 62 hours old. Baby having Tsb drawn when this LC entered the room. Parents aware of baby's weight increase. Mom reviewed history of hospital stay with many questions and concerns. Listened to mom calmly recount her experiences in the hospital. Mom questioned if bili lights could be started soon if needed to prevent an additional stay in the hospital--parents enc to discuss with pediatrician once serum results obtained. Mom reports that baby is latching well and is still willing to nurse 15-20 minutes prior to being supplemented with formula by bottle. Mom states that her EBM is transitioning-becoming more white. Enc mom to continue to put baby to breast with cues and offer STS by 3 hours. Enc parents to continue to supplement with EBM/formula--at least 30 ml--and discussed how to gradually increase if baby not satisfied. Enc mom to continue to post-pump followed by hand expression.   Mom had questions about nipple confusion or baby preferring bottle. Enc continuing to put baby to breast first with each feeding and enc starting SNS to supplement baby at breast tomorrow (12/21/16) if mom desires. Mom agreed that keeping the current plan for FOB supplementing with bottle through the night is best for now. Mom declined any assistance with latching/pumping at this time. Parents requested additional formula and it was given. Parents and grandparents thanked [facility] for assistance and report that they have no further questions or needs at this time. Discussed assessment and interventions with Dorene GrebeNatalie, RN and Leotis ShamesLauren, PNP.    Maternal Data    Feeding Feeding Type: Breast Fed Length of feed: 20 min  LATCH Score                   Interventions    Lactation Tools Discussed/Used Tools: Pump Breast pump type:  Double-Electric Breast Pump   Consult Status Consult Status: Follow-up Date: 12/21/16 Follow-up type: In-patient    Sherlyn HayJennifer D Sharmel Ballantine 12/20/2016, 5:01 PM

## 2016-12-21 ENCOUNTER — Ambulatory Visit: Payer: Self-pay

## 2016-12-21 NOTE — Lactation Note (Signed)
This note was copied from a baby's chart. Lactation Consultation Note  Patient Name: Kimberly [name] Reason for consult: Follow-up assessment Baby at 80 hr of life. Dyad set for d/c toady and mom had questions about supply. She was concerned that her milk was "coming in then stopped". No lumps, heat, or red streaks noted at this visit. Demonstrated manual expression, large drops transitional milk dripping bilaterally. Mom has a Medela DEBP at home that she knows how to use. Dad is very involved with feedings. Mom plans to put baby to breast every 3hr on demand, post pump, and supplement with a bottle per volume guidelines. She will decrease the formula as her expressed volume increases. She will gradually stop pumping as baby is able to bf more efficiently. She plans to f[place] in [place] on 12/22/16. Encouraged her to f/u with lactation on 12/24/16, gave options for lactation OP visits. MGM at bedside and supportive.     Maternal Data    Feeding Feeding Type: Breast Fed  LATCH Score                   Interventions    Lactation Tools Discussed/Used     Consult Status Consult Status: Complete Date: 12/24/16 Follow-up type: Out-patient    Kimberly [name] [name], 11:30 AM

## 2016-12-22 ENCOUNTER — Telehealth: Payer: Self-pay

## 2016-12-22 NOTE — Telephone Encounter (Signed)
I received a message from [employer] CompaniesJulie P. Degele, MD asking us to have pt come in Monday (12/22/16) or Tuesday (12/23/16) for a BP check. I called pt and she didn't answer so I left a message asking her to call the office to schedule a time for Monday or Tuesday to check her BP.

## 2016-12-23 ENCOUNTER — Encounter: Payer: PRIVATE HEALTH INSURANCE | Admitting: Obstetrics and Gynecology

## 2017-01-02 ENCOUNTER — Ambulatory Visit: Payer: PRIVATE HEALTH INSURANCE | Admitting: Advanced Practice Midwife

## 2017-01-05 ENCOUNTER — Encounter: Payer: Self-pay | Admitting: Obstetrics and Gynecology

## 2017-01-05 ENCOUNTER — Ambulatory Visit (INDEPENDENT_AMBULATORY_CARE_PROVIDER_SITE_OTHER): Payer: PRIVATE HEALTH INSURANCE | Admitting: Obstetrics and Gynecology

## 2017-01-05 VITALS — BP 124/86 | HR 81 | Resp 16 | Ht 69.0 in | Wt 212.0 lb

## 2017-01-05 DIAGNOSIS — F418 Other specified anxiety disorders: Secondary | ICD-10-CM

## 2017-01-05 DIAGNOSIS — Z1389 Encounter for screening for other disorder: Secondary | ICD-10-CM

## 2017-01-05 DIAGNOSIS — O99345 Other mental disorders complicating the puerperium: Secondary | ICD-10-CM

## 2017-01-05 DIAGNOSIS — F53 Postpartum depression: Secondary | ICD-10-CM

## 2017-01-05 MED ORDER — SERTRALINE HCL 50 MG PO TABS: 50.0000 mg | ORAL_TABLET | Freq: Every day | ORAL | 6 refills | Status: DC

## 2017-01-05 NOTE — Progress Notes (Addendum)
Patient ID: Kimberly [name], female   DOB: 1975/10/03, 41 y.o.   MRN: [id number] S: Kimberly [name] is a 41 y.o. G1P1001 who is 40 wks postpartum here today with complaints of postpartum depression and anxiety.  She reports "crying all the time, feeling very overwhelmed, very anxious, feeling lonely when having to nurse baby in the middle of the night and when husband has to leave home to go to work". She and her spouse both report how difficult it is to adjust to having a newborn and "stick to their schedule". She reports a fam hx and personal hx of depression. She took Cymbalta from 2011 until sometime in 2012. She was able to be weaned off.  Her depression was managed by her PCP. She also reports her frustration with being told "different tips by everyone about breastfeeding".  She feels overwhelmed by instructions given from a LC at [facility]. She was instructed to nurse baby every 3 hrs and to wake the baby up to be fed.  She has overwhelming feelings about this stringent schedule, because the baby "is not sleeping that much".  She states she only gets about 2-3 hours of sleep every night. [place] PPD Score = 19.  O: Review of Systems - History obtained from the patient General ROS: negative Psychological ROS: positive for - anxiety, depression Breast ROS: "feels like not producing a lot of milk"; having to supplement with formula a few times.  Physical Exam: BP 124/86   Pulse 81   Resp 16   Ht  (1.753 m)   Wt 212 lb (96.2 kg)   BMI 31.31 kg/m   VS reviewed, nursing note reviewed,  Constitutional: well developed, well nourished, mild distress Neuro: alert and oriented x 3 Skin: warm, dry Psych: affect depressed, very tearful  A/P: Postpartum depression  - Plan: sertraline (ZOLOFT) take 25 mg daily x 7 days; then 50 MG daily  - Ambulatory referral to [facility])  Postpartum anxiety  - Plan: sertraline (ZOLOFT) take 25 mg daily x 7 days; then 50 MG  daily  - Discussed that SSRIs will take up to 2 wks of daily use before she will feel the effects of the medication - Discussed some lifestyle changes that can help alleviate some of her anxieties - Recommend taking a small amount of alone time daily or QOD once spouse comes home from work - Recommend getting outside with baby to get fresh air and get some exercise - Ambulatory referral to Behavioral [facility]) - Patient verbalized an understanding of the plan of care and agrees. - F/U with office at 6 wks PP visit or prn 100% of 30 min visit was spent in discussing PPD and anxiety, meds, lifestyle changes, coping mechanisms and coordination of care.  Raelyn Mora, CNM 01/05/2017 11:28 AM

## 2017-01-05 NOTE — Patient Instructions (Signed)
Postpartum Depression and Baby Blues The postpartum period begins right after the birth of a baby. During this time, there is often a great amount of joy and excitement. It is also a time of many changes in the life of the parents. Regardless of how many times a mother gives birth, each child brings new challenges and dynamics to the family. It is not unusual to have feelings of excitement along with confusing shifts in moods, emotions, and thoughts. All mothers are at risk of developing postpartum depression or the "baby blues." These mood changes can occur right after giving birth, or they may occur many months after giving birth. The baby blues or postpartum depression can be mild or severe. Additionally, postpartum depression can go away rather quickly, or it can be a long-term condition. What are the causes? Raised hormone levels and the rapid drop in those levels are thought to be a main cause of postpartum depression and the baby blues. A number of hormones change during and after pregnancy. Estrogen and progesterone usually decrease right after the delivery of your baby. The levels of thyroid hormone and various cortisol steroids also rapidly drop. Other factors that play a role in these mood changes include major life events and genetics. What increases the risk? If you have any of the following risks for the baby blues or postpartum depression, know what symptoms to watch out for during the postpartum period. Risk factors that may increase the likelihood of getting the baby blues or postpartum depression include:  Having a personal or family history of depression.  Having depression while being pregnant.  Having premenstrual mood issues or mood issues related to oral contraceptives.  Having a lot of life stress.  Having marital conflict.  Lacking a social support network.  Having a baby with special needs.  Having health problems, such as diabetes.  What are the signs or  symptoms? Symptoms of baby blues include:  Brief changes in mood, such as going from extreme happiness to sadness.  Decreased concentration.  Difficulty sleeping.  Crying spells, tearfulness.  Irritability.  Anxiety.  Symptoms of postpartum depression typically begin within the first month after giving birth. These symptoms include:  Difficulty sleeping or excessive sleepiness.  Marked weight loss.  Agitation.  Feelings of worthlessness.  Lack of interest in activity or food.  Postpartum psychosis is a very serious condition and can be dangerous. Fortunately, it is rare. Displaying any of the following symptoms is cause for immediate medical attention. Symptoms of postpartum psychosis include:  Hallucinations and delusions.  Bizarre or disorganized behavior.  Confusion or disorientation.  How is this diagnosed? A diagnosis is made by an evaluation of your symptoms. There are no medical or lab tests that lead to a diagnosis, but there are various questionnaires that a health care provider may use to identify those with the baby blues, postpartum depression, or psychosis. Often, a screening tool called the Edinburgh Postnatal Depression Scale is used to diagnose depression in the postpartum period. How is this treated? The baby blues usually goes away on its own in 1-2 weeks. Social support is often all that is needed. You will be encouraged to get adequate sleep and rest. Occasionally, you may be given medicines to help you sleep. Postpartum depression requires treatment because it can last several months or longer if it is not treated. Treatment may include individual or group therapy, medicine, or both to address any social, physiological, and psychological factors that may play a role in the   depression. Regular exercise, a healthy diet, rest, and social support may also be strongly recommended. Postpartum psychosis is more serious and needs treatment right away.  Hospitalization is often needed. Follow these instructions at home:  Get as much rest as you can. Nap when the baby sleeps.  Exercise regularly. Some women find yoga and walking to be beneficial.  Eat a balanced and nourishing diet.  Do little things that you enjoy. Have a cup of tea, take a bubble bath, read your favorite magazine, or listen to your favorite music.  Avoid alcohol.  Ask for help with household chores, cooking, grocery shopping, or running errands as needed. Do not try to do everything.  Talk to people close to you about how you are feeling. Get support from your partner, family members, friends, or other new moms.  Try to stay positive in how you think. Think about the things you are grateful for.  Do not spend a lot of time alone.  Only take over-the-counter or prescription medicine as directed by your health care provider.  Keep all your postpartum appointments.  Let your health care provider know if you have any concerns. Contact a health care provider if: You are having a reaction to or problems with your medicine. Get help right away if:  You have suicidal feelings.  You think you may harm the baby or someone else. This information is not intended to replace advice given to you by your health care provider. Make sure you discuss any questions you have with your health care provider. Document Released: 01/03/2004 Document Revised: 09/06/2015 Document Reviewed: 01/10/2013 Elsevier Interactive Patient Education  2017 [employer]. Perinatal Depression When a woman feels excessive sadness, anger, or anxiety during pregnancy or during the first 12 months after she gives birth, she has a condition called perinatal depression. Depression can interfere with work, school, relationships, and other everyday activities. If it is not managed properly, it can also cause problems in the mother and her baby. Sometimes, perinatal depression is left untreated because  symptoms are thought to be normal mood swings during and right after pregnancy. If you have symptoms of depression, it is important to talk with your health care provider. What are the causes? The exact cause of this condition is not known. Hormonal changes during and after pregnancy may play a role in causing perinatal depression. What increases the risk? You are more likely to develop this condition if:  You have a personal or family history of depression, anxiety, or mood disorders.  You experience a stressful life event during pregnancy, such as the death of a loved one.  You have a lot of regular life stress.  You do not have support from family members or loved ones, or you are in an abusive relationship.  What are the signs or symptoms? Symptoms of this condition include:  Feeling sad or hopeless.  Feelings of guilt.  Feeling irritable or overwhelmed.  Changes in your appetite.  Lack of energy or motivation.  Sleep problems.  Difficulty concentrating or completing tasks.  Loss of interest in hobbies or relationships.  Headaches or stomach problems that do not go away.  How is this diagnosed? This condition is diagnosed based on a physical exam and mental evaluation. In some cases, your health care provider may use a depression screening tool. These tools include a list of questions that can help a health care provider diagnose depression. Your health care provider may refer you to a mental health expert who  specializes in depression. How is this treated? This condition may be treated with:  Medicines. Your health care provider will only give you medicines that have been proven safe for pregnancy and breastfeeding.  Talk therapy with a mental health professional to help change your patterns of thinking (cognitive behavioral therapy).  Support groups.  Brain stimulation or light therapies.  Stress reduction therapies, such as mindfulness.  Follow these  instructions at home: Lifestyle  Do not use any products that contain nicotine or tobacco, such as cigarettes and e-cigarettes. If you need help quitting, ask your health care provider.  Do not use alcohol when you are pregnant. After your baby is born, limit alcohol intake to no more than 1 drink a day. One drink equals 12 oz of beer, 5 oz of wine, or 1 oz of hard liquor.  Consider joining a support group for new mothers. Ask your health care provider for recommendations.  Take good care of yourself. Make sure you: ? Get plenty of sleep. If you are having trouble sleeping, talk with your health care provider. ? Eat a healthy diet. This includes plenty of fruits and vegetables, whole grains, and lean proteins. ? Exercise regularly, as told by your health care provider. Ask your health care provider what exercises are safe for you. General instructions  Take over-the-counter and prescription medicines only as told by your health care provider.  Talk with your partner or family members about your feelings during pregnancy. Share any concerns or anxieties that you may have.  Ask for help with tasks or chores when you need it. Ask friends and family members to provide meals, watch your children, or help with cleaning.  Keep all follow-up visits as told by your health care provider. This is important. Contact a health care provider if:  You (or people close to you) notice that you have any symptoms of depression.  You have depression and your symptoms get worse.  You experience side effects from medicines, such as nausea or sleep problems. Get help right away if:  You feel like hurting yourself, your baby, or someone else. If you ever feel like you may hurt yourself or others, or have thoughts about taking your own life, get help right away. You can go to your nearest emergency department or call:  Your local emergency services (911 in the [place]  A suicide crisis helpline, such as  the National Suicide Prevention Lifeline at 212-168-1229. This is open 24 hours a day.  Summary  Perinatal depression is when a woman feels excessive sadness, anger, or anxiety during pregnancy or during the first 12 months after she gives birth.  If perinatal depression is not treated, it can lead to health problems for the mother and her baby.  This condition is treated with medicines, talk therapy, stress reduction therapies, or a combination of two or more treatments.  Talk with your partner or family members about your feelings. Do not be afraid to ask for help. This information is not intended to replace advice given to you by your health care provider. Make sure you discuss any questions you have with your health care provider. Document Released: 05/28/2016 Document Revised: 05/28/2016 Document Reviewed: 05/28/2016 Elsevier Interactive Patient Education  2018 Elsevier Inc. Perinatal Anxiety When a woman feels excessive tension or worry (anxiety) during pregnancy or during the first 12 months after she gives birth, she has a condition called perinatal anxiety. Anxiety can interfere with work, school, relationships, and other everyday activities. If it  is not managed properly, it can also cause problems in the mother and her baby.  If you are pregnant and you have symptoms of an anxiety disorder, it is important to talk with your health care provider. What are the causes? The exact cause of this condition is not known. Hormonal changes during and after pregnancy may play a role in causing perinatal anxiety. What increases the risk? You are more likely to develop this condition if:  You have a personal or family history of depression, anxiety, or mood disorders.  You experience a stressful life event during pregnancy, such as the death of a loved one.  You have a lot of regular life stress, such as being a single parent.  You have thyroid problems.  What are the signs or  symptoms? Perinatal anxiety can be different for everyone. It may include:  Panic attacks (panic disorder). These are intense episodes of fear or discomfort that may also cause sweating, nausea, shortness of breath, or fear of dying. They usually last 5-15 minutes.  Reliving an upsetting (traumatic) event through distressing thoughts, dreams, or flashbacks (post-traumatic stress disorder, or PTSD).  Excessive worry about multiple problems (generalized anxiety disorder).  Fear and stress about leaving certain people or loved ones (separation anxiety).  Performing repetitive tasks (compulsions) to relieve stress or worry (obsessive compulsive disorder, or OCD).  Fear of certain objects or situations (phobias).  Excessive worrying, such as a constant feeling that something bad is going to happen.  Inability to relax.  Difficulty concentrating.  Sleep problems.  Frequent nightmares or disturbing thoughts.  How is this diagnosed? This condition is diagnosed based on a physical exam and mental evaluation. In some cases, your health care provider may use an anxiety screening tool. These tools include a list of questions that can help a health care provider diagnose anxiety. Your health care provider may refer you to a mental health expert who specializes in anxiety. How is this treated? This condition may be treated with:  Medicines. Your health care provider will only give you medicines that have been proven safe for pregnancy and breastfeeding.  Talk therapy with a mental health professional to help change your patterns of thinking (cognitive behavioral therapy).  Mindfulness-based stress reduction.  Other relaxation therapies, such as deep breathing or guided muscle relaxation.  Support groups.  Follow these instructions at home: Lifestyle  Do not use any products that contain nicotine or tobacco, such as cigarettes and e-cigarettes. If you need help quitting, ask your health  care provider.  Do not use alcohol when you are pregnant. After your baby is born, limit alcohol intake to no more than 1 drink a day. One drink equals 12 oz of beer, 5 oz of wine, or 1 oz of hard liquor.  Consider joining a support group for new mothers. Ask your health care provider for recommendations.  Take good care of yourself. Make sure you: ? Get plenty of sleep. If you are having trouble sleeping, talk with your health care provider. ? Eat a healthy diet. This includes plenty of fruits and vegetables, whole grains, and lean proteins. ? Exercise regularly, as told by your health care provider. Ask your health care provider what exercises are safe for you. General instructions  Take over-the-counter and prescription medicines only as told by your health care provider.  Talk with your partner or family members about your feelings during pregnancy. Share any concerns or fears that you may have.  Ask for help with tasks  or chores when you need it. Ask friends and family members to provide meals, watch your children, or help with cleaning.  Keep all follow-up visits as told by your health care provider. This is important. Contact a health care provider if:  You (or people close to you) notice that you have any symptoms of anxiety or depression.  You have anxiety and your symptoms get worse.  You experience side effects from medicines, such as nausea or sleep problems. Get help right away if:  You feel like hurting yourself, your baby, or someone else. If you ever feel like you may hurt yourself or others, or have thoughts about taking your own life, get help right away. You can go to your nearest emergency department or call:  Your local emergency services (911 in the [place]  A suicide crisis helpline, such as the National Suicide Prevention Lifeline at (207) 264-4256. This is open 24 hours a day.  Summary  Perinatal anxiety is when a woman feels excessive tension or worry  during pregnancy or during the first 12 months after she gives birth.  Perinatal anxiety may include panic attacks, post-traumatic stress disorder, separation anxiety, phobias, or generalized anxiety.  Perinatal anxiety can cause physical health problems in the mother and baby if not properly managed.  This condition is treated with medicines, talk therapy, stress reduction therapies, or a combination of two or more treatments.  Talk with your partner or family members about your concerns or fears. Do not be afraid to ask for help. This information is not intended to replace advice given to you by your health care provider. Make sure you discuss any questions you have with your health care provider. Document Released: 05/28/2016 Document Revised: 05/28/2016 Document Reviewed: 05/28/2016 Elsevier Interactive Patient Education  [employer].

## 2017-01-15 ENCOUNTER — Encounter: Payer: Self-pay | Admitting: Obstetrics & Gynecology

## 2017-01-15 ENCOUNTER — Ambulatory Visit (INDEPENDENT_AMBULATORY_CARE_PROVIDER_SITE_OTHER): Payer: PRIVATE HEALTH INSURANCE | Admitting: Obstetrics & Gynecology

## 2017-01-15 ENCOUNTER — Encounter: Payer: Self-pay | Admitting: *Deleted

## 2017-01-15 VITALS — BP 134/89 | HR 74 | Wt 216.0 lb

## 2017-01-15 DIAGNOSIS — Z8759 Personal history of other complications of pregnancy, childbirth and the puerperium: Secondary | ICD-10-CM | POA: Insufficient documentation

## 2017-01-15 DIAGNOSIS — R200 Anesthesia of skin: Secondary | ICD-10-CM

## 2017-01-15 NOTE — Progress Notes (Signed)
Post Partum Exam  Kimberly [name] is a 41 y.o. G64P1001 female who presents for a postpartum visit. She is 4 weeks postpartum following a spontaneous vaginal delivery with 2nd degree perineal laceration..She had IOL due to severe PIH. I have fully reviewed the prenatal and intrapartum course. The delivery was at 37w 6d gestational weeks.  Anesthesia: epidural. Postpartum course has been eventful with depression and breast feeding issues.  She has consulted with Lactation consultant x 2 outpatient. Baby's course has been unremarkable except for an incident where she had a head laceration from their dog.Pecola Leisure is feeding by both breast and formula supplements. Bleeding no bleeding. Bowel function is normal. Bladder function is normal. Patient is not sexually active. Contraception method is condoms. Postpartum depression screening:neg.  Pt complaining of leg outer leg numbness since delivery.  Mild.  No weakness.  NO pain.  The following portions of the patient's history were reviewed and updated as appropriate: allergies, current medications, past family history, past medical history, past social history, past surgical history and problem list.  Review of Systems Pertinent items noted in HPI and remainder of comprehensive ROS otherwise negative.    Objective:  unknown if currently breastfeeding.  General:  alert   Breasts:  inspection negative, no nipple discharge or bleeding, no masses or nodularity palpable  Lungs: clear to auscultation bilaterally  Heart:  regular rate and rhythm  Abdomen: soft, non-tender; bowel sounds normal; no masses,  no organomegaly   Vulva:  normal  Vagina: normal vagina  Cervix:  no lesions  Corpus: normal size, contour, position, consistency, mobility, non-tender  Adnexa:  normal adnexa  Rectal Exam: Not performed.       Strength 5/5 in LE.   Assessment:    Nml postpartum exam. Pap smear not done at today's visit.   Plan:   1. Contraception: coitus interruptus,  condoms and vasectomy 2. No depression.   3. Follow up in: 1 year or as needed.  4.  Referral to dr. Karie Schwalbe for left leg numbness 5.  Night splints for carpel tunnel symptoms.

## 2017-01-21 ENCOUNTER — Other Ambulatory Visit: Payer: Self-pay | Admitting: *Deleted

## 2017-01-21 DIAGNOSIS — O99345 Other mental disorders complicating the puerperium: Principal | ICD-10-CM

## 2017-01-21 DIAGNOSIS — F53 Postpartum depression: Secondary | ICD-10-CM

## 2017-02-11 ENCOUNTER — Ambulatory Visit (HOSPITAL_COMMUNITY): Payer: PRIVATE HEALTH INSURANCE | Admitting: Licensed Clinical Social Worker

## 2017-06-29 DIAGNOSIS — F324 Major depressive disorder, single episode, in partial remission: Secondary | ICD-10-CM | POA: Diagnosis not present

## 2017-06-29 DIAGNOSIS — S63502A Unspecified sprain of left wrist, initial encounter: Secondary | ICD-10-CM | POA: Diagnosis not present

## 2017-06-29 DIAGNOSIS — M545 Low back pain: Secondary | ICD-10-CM | POA: Diagnosis not present

## 2017-09-04 DIAGNOSIS — J069 Acute upper respiratory infection, unspecified: Secondary | ICD-10-CM | POA: Diagnosis not present

## 2017-09-23 DIAGNOSIS — H6981 Other specified disorders of Eustachian tube, right ear: Secondary | ICD-10-CM | POA: Diagnosis not present

## 2018-01-04 DIAGNOSIS — F324 Major depressive disorder, single episode, in partial remission: Secondary | ICD-10-CM | POA: Diagnosis not present

## 2018-01-04 DIAGNOSIS — I10 Essential (primary) hypertension: Secondary | ICD-10-CM | POA: Diagnosis not present

## 2018-01-04 DIAGNOSIS — F419 Anxiety disorder, unspecified: Secondary | ICD-10-CM | POA: Diagnosis not present

## 2018-01-04 DIAGNOSIS — Z0000 Encounter for general adult medical examination without abnormal findings: Secondary | ICD-10-CM | POA: Diagnosis not present

## 2018-01-04 DIAGNOSIS — M545 Low back pain: Secondary | ICD-10-CM | POA: Diagnosis not present

## 2018-01-13 DIAGNOSIS — Z0000 Encounter for general adult medical examination without abnormal findings: Secondary | ICD-10-CM | POA: Diagnosis not present

## 2018-01-13 DIAGNOSIS — Z136 Encounter for screening for cardiovascular disorders: Secondary | ICD-10-CM | POA: Diagnosis not present

## 2018-01-14 ENCOUNTER — Emergency Department (INDEPENDENT_AMBULATORY_CARE_PROVIDER_SITE_OTHER): Payer: BLUE CROSS/BLUE SHIELD

## 2018-01-14 ENCOUNTER — Other Ambulatory Visit: Payer: Self-pay

## 2018-01-14 ENCOUNTER — Emergency Department (INDEPENDENT_AMBULATORY_CARE_PROVIDER_SITE_OTHER)
Admission: EM | Admit: 2018-01-14 | Discharge: 2018-01-14 | Disposition: A | Discharge status: Home / Self Care | Payer: BLUE CROSS/BLUE SHIELD | Source: Home / Self Care | Attending: Family Medicine | Admitting: Family Medicine

## 2018-01-14 DIAGNOSIS — R1032 Left lower quadrant pain: Secondary | ICD-10-CM

## 2018-01-14 LAB — POCT CBC W AUTO DIFF (K'VILLE URGENT CARE)

## 2018-01-14 LAB — POCT URINALYSIS DIP (MANUAL ENTRY)
BILIRUBIN UA: NEGATIVE
BILIRUBIN UA: NEGATIVE mg/dL
GLUCOSE UA: NEGATIVE mg/dL
Leukocytes, UA: NEGATIVE
Nitrite, UA: NEGATIVE
Protein Ur, POC: NEGATIVE mg/dL
RBC UA: NEGATIVE
SPEC GRAV UA: 1.025 (ref 1.010–1.025)
UROBILINOGEN UA: 0.2 U/dL
pH, UA: 6 (ref 5.0–8.0)

## 2018-01-14 LAB — POCT URINE PREGNANCY: PREG TEST UR: NEGATIVE

## 2018-01-14 NOTE — ED Triage Notes (Signed)
Pt c/o lower left abd pain since last night. Took tums with no relief. Sharp shooting pain that's dulled with ibuprofen. Denies any pain like this before. Pain is currently at 4/10 after having taken motrin 600mg  at 8:15am

## 2018-01-14 NOTE — ED Provider Notes (Signed)
[facility]    CSN: [id number] Arrival date & time: 01/14/18  1310     History   Chief Complaint Chief Complaint  Patient presents with  . Abdominal Pain    lower left    HPI Kimberly [name] is a 42 y.o. female.   At about midnight last night patient developed a brief sharp pain in her left lower abdomen.  The pain has persisted but remains intermittent, and does not radiate.  She feels bloated at times.  The pain improves when she sits, and improved after taking ibuprofen 600mg .  Her bowel movements have been normal and she denies urinary symptoms.  Her last menstrual period was three weeks ago and normal.  She denies fevers, chills, and sweats.  She has a past history of cholecystectomy 10 years ago.  Family history of diverticulitis (paternal great grandmother).  She notes that her husband has had a vasectomy.  The history is provided by the patient.  Abdominal Pain  Pain location:  LLQ Pain quality: bloating and sharp   Pain radiates to:  Does not radiate Pain severity:  Moderate Onset quality:  Sudden Duration:  1 day Progression:  Unchanged Chronicity:  New Context: awakening from sleep   Relieved by:  NSAIDs Worsened by:  Nothing Ineffective treatments:  None tried Associated symptoms: no anorexia, no belching, no chest pain, no chills, no constipation, no cough, no diarrhea, no dysuria, no fatigue, no fever, no flatus, no hematemesis, no hematochezia, no hematuria, no melena, no nausea, no shortness of breath, no vaginal bleeding, no vaginal discharge and no vomiting     Past Medical History:  Diagnosis Date  . Gallstones   . Pregnancy induced hypertension     Patient Active Problem List   Diagnosis Date Noted  . History of severe pre-eclampsia 01/15/2017    Past Surgical History:  Procedure Laterality Date  . CHOLECYSTECTOMY      OB History    Gravida  1   Para  1   Term  1   Preterm  0   AB  0   Living  1     SAB  0   TAB  0    Ectopic  0   Multiple  0   Live Births  1            Home Medications    Prior to Admission medications   Medication Sig Start Date End Date Taking? Authorizing Provider  amLODipine (NORVASC) 5 MG tablet Take 1 tablet (5 mg total) by mouth daily. 12/20/16   Degele, Kandra Nicolas, MD  ibuprofen (ADVIL,MOTRIN) 600 MG tablet Take 1 tablet (600 mg total) by mouth every 6 (six) hours. 12/19/16   Degele, Kandra Nicolas, MD  Omega-3 Fatty Acids (FISH OIL) 500 MG CAPS Take 1 capsule by mouth daily.     [provider]  Prenatal Vit-Fe Fumarate-FA (PRENATAL VITAMIN PO) Take 1 tablet by mouth daily.     [provider]  sertraline (ZOLOFT) 50 MG tablet Take 1 tablet (50 mg total) by mouth daily. Take 1/2 tablet daily for 7 days, then increase to 1 tablet daily. Patient not taking: Reported on 01/15/2017 01/05/17   Raelyn Mora, CNM    Family History Family History  Problem Relation Age of Onset  . Heart disease Mother   . Hypertension Father   . Heart disease Father   . Hypertension Maternal Grandmother     Social History Social History   Tobacco Use  .  Smoking status: Never Smoker  . Smokeless tobacco: Never Used  Substance Use Topics  . Alcohol use: Yes    Alcohol/week: 1.0 standard drinks    Types: 1 Standard drinks or equivalent per week  . Drug use: No     Allergies   Patient has no known allergies.   Review of Systems Review of Systems  Constitutional: Negative for chills, fatigue and fever.  Respiratory: Negative for cough and shortness of breath.   Cardiovascular: Negative for chest pain.  Gastrointestinal: Positive for abdominal pain. Negative for anorexia, constipation, diarrhea, flatus, hematemesis, hematochezia, melena, nausea and vomiting.  Genitourinary: Negative for dysuria, hematuria, vaginal bleeding and vaginal discharge.  All other systems reviewed and are negative.    Physical Exam Triage Vital Signs ED Triage Vitals  Enc Vitals Group      BP      Pulse      Resp      Temp      Temp src      SpO2      Weight      Height      Head Circumference      Peak Flow      Pain Score      Pain Loc      Pain Edu?      Excl. in GC?    No data found.  Updated Vital Signs BP (!) 148/93 (BP Location: Right Arm)   Pulse 80   Temp 98.6 F (37 C) (Oral)   Ht 5\' 9"  (1.753 m)   Wt 105.7 kg   SpO2 100%   BMI 34.41 kg/m   Visual Acuity Right Eye Distance:   Left Eye Distance:   Bilateral Distance:    Right Eye Near:   Left Eye Near:    Bilateral Near:     Physical Exam  Constitutional: She appears well-developed and well-nourished. No distress.  HENT:  Head: Normocephalic.  Right Ear: External ear normal.  Left Ear: External ear normal.  Nose: Nose normal.  Mouth/Throat: Oropharynx is clear and moist.  Eyes: Pupils are equal, round, and reactive to light. Conjunctivae are normal.  Neck: Neck supple.  Cardiovascular: Normal heart sounds.  Pulmonary/Chest: Breath sounds normal.  Abdominal: Soft. Normal appearance and bowel sounds are normal. She exhibits no distension. There is no hepatosplenomegaly. There is tenderness in the left lower quadrant. There is no rigidity, no rebound, no guarding, no CVA tenderness, no tenderness at McBurney's point and negative Murphy's sign. Hernia confirmed negative in the ventral area.    Musculoskeletal: She exhibits no edema.  Lymphadenopathy:    She has no cervical adenopathy.  Neurological: She is alert.  Skin: Skin is warm and dry. No rash noted.  Nursing note and vitals reviewed.    UC Treatments / Results  Labs (all labs ordered are listed, but only abnormal results are displayed) Labs Reviewed  POCT CBC W AUTO DIFF ([facility]):  WBC 8.4; LY 35.2; MO 4.7; GR 60.1; Hgb 12.9; Platelets 295   POCT URINALYSIS DIP (MANUAL ENTRY) negative  POCT URINE PREGNANCY negative    EKG None  Radiology Dg Abdomen 1 View  Result Date: 01/14/2018 CLINICAL DATA:  Left  lower quadrant pain, bloating EXAM: ABDOMEN - 1 VIEW COMPARISON:  None. FINDINGS: The bowel gas pattern is normal. Moderate amount of stool throughout the colon. No radio-opaque calculi or other significant radiographic abnormality are seen. IMPRESSION: Negative. Electronically Signed   By: Elige Ko   On:  01/14/2018 15:42    Procedures Procedures (including critical care time)  Medications Ordered in UC Medications - No data to display  Initial Impression / Assessment and Plan / UC Course  I have reviewed the triage vital signs and the nursing notes.  Pertinent labs & imaging results that were available during my care of the patient were reviewed by me and considered in my medical decision making (see chart for details).    Normal WBC and urinalysis reassuring.  Note moderate stool throughout colon on x-ray.  Suspect constipation as source of pain. Followup with Family Doctor if not improved in one week.    Final Clinical Impressions(s) / UC Diagnoses   Final diagnoses:  Left lower quadrant abdominal pain     Discharge Instructions     Recommend starting Miralax, 1/2 to one capful mixed in 4 to 8 ounces water daily.    ED Prescriptions    None        Lattie Haw, MD 01/16/18 2110

## 2018-01-14 NOTE — Discharge Instructions (Addendum)
Recommend starting Miralax, 1/2 to one capful mixed in 4 to 8 ounces water daily.

## 2018-03-15 ENCOUNTER — Ambulatory Visit (INDEPENDENT_AMBULATORY_CARE_PROVIDER_SITE_OTHER): Payer: BLUE CROSS/BLUE SHIELD

## 2018-03-15 DIAGNOSIS — R350 Frequency of micturition: Secondary | ICD-10-CM

## 2018-03-15 LAB — POCT URINALYSIS DIPSTICK
Bilirubin, UA: NEGATIVE
GLUCOSE UA: NEGATIVE
KETONES UA: NEGATIVE
Nitrite, UA: NEGATIVE
PH UA: 5 (ref 5.0–8.0)
Protein, UA: NEGATIVE
RBC UA: NEGATIVE
SPEC GRAV UA: 1.01 (ref 1.010–1.025)
UROBILINOGEN UA: 0.2 U/dL

## 2018-03-15 NOTE — Progress Notes (Signed)
PT c/o frequent urination and dull, lower back pain since this morning. UA done and culture sent. PT is aware that we will call with results and treat if necessary.

## 2018-03-17 ENCOUNTER — Telehealth: Payer: Self-pay

## 2018-03-17 LAB — URINE CULTURE: Organism ID, Bacteria: NO GROWTH

## 2018-03-17 NOTE — Telephone Encounter (Signed)
Returned pt call for urine culture results. I told the pt that her culture came back negative so we would not be sending antibiotics in but, if she is feeling bad then she can be seen by her PCP. PT states she is very uncomfortable and does not have time to leave work to go to the doctor and wanted us to send her an antibiotic in anyway. I told her that we cannot send it in with the results being negative and that she can try something over the counter or go be seen by urgent care when she gets off work.

## 2018-03-23 ENCOUNTER — Ambulatory Visit: Payer: BLUE CROSS/BLUE SHIELD | Admitting: Certified Nurse Midwife

## 2018-03-24 ENCOUNTER — Encounter: Payer: Self-pay | Admitting: *Deleted

## 2018-03-24 ENCOUNTER — Ambulatory Visit (INDEPENDENT_AMBULATORY_CARE_PROVIDER_SITE_OTHER): Payer: BLUE CROSS/BLUE SHIELD | Admitting: *Deleted

## 2018-03-24 VITALS — Temp 98.4°F

## 2018-03-24 DIAGNOSIS — R309 Painful micturition, unspecified: Secondary | ICD-10-CM

## 2018-03-24 LAB — POCT URINALYSIS DIPSTICK
Bilirubin, UA: NEGATIVE
Glucose, UA: NEGATIVE
KETONES UA: NEGATIVE
NITRITE UA: POSITIVE
PROTEIN UA: NEGATIVE
SPEC GRAV UA: 1.01 (ref 1.010–1.025)
Urobilinogen, UA: NEGATIVE E.U./dL — AB
pH, UA: 6.5 (ref 5.0–8.0)

## 2018-03-24 MED ORDER — SULFAMETHOXAZOLE-TRIMETHOPRIM 800-160 MG PO TABS: 1.0000 | ORAL_TABLET | Freq: Two times a day (BID) | ORAL | 0 refills | Status: DC

## 2018-03-24 NOTE — Progress Notes (Signed)
.  SUBJECTIVE: Kimberly [name] is a 42 y.o. female who complains of urinary frequency, urgency and dysuria x 4 days, without flank pain, fever, chills, or abnormal vaginal discharge or bleeding.   OBJECTIVE: Appears well, in no apparent distress.  Vital signs are normal. Urine dipstick shows positive for WBC's and RBC's.    ASSESSMENT: Dysuria  PLAN: Treatment per orders.  Call or return to clinic prn if these symptoms worsen or fail to improve as anticipated. Bactrim Ds BID for 5 days and Urine culture sent

## 2018-03-25 ENCOUNTER — Ambulatory Visit: Payer: BLUE CROSS/BLUE SHIELD | Admitting: Obstetrics & Gynecology

## 2018-03-26 LAB — URINE CULTURE
MICRO NUMBER:: 91484026
SPECIMEN QUALITY:: ADEQUATE

## 2018-04-08 ENCOUNTER — Ambulatory Visit: Payer: BLUE CROSS/BLUE SHIELD | Admitting: Obstetrics & Gynecology

## 2018-04-08 DIAGNOSIS — Z01419 Encounter for gynecological examination (general) (routine) without abnormal findings: Secondary | ICD-10-CM

## 2018-07-07 DIAGNOSIS — M545 Low back pain: Secondary | ICD-10-CM | POA: Diagnosis not present

## 2018-07-07 DIAGNOSIS — F324 Major depressive disorder, single episode, in partial remission: Secondary | ICD-10-CM | POA: Diagnosis not present

## 2018-07-07 DIAGNOSIS — I10 Essential (primary) hypertension: Secondary | ICD-10-CM | POA: Diagnosis not present

## 2018-07-07 DIAGNOSIS — F419 Anxiety disorder, unspecified: Secondary | ICD-10-CM | POA: Diagnosis not present

## 2018-12-01 DIAGNOSIS — D225 Melanocytic nevi of trunk: Secondary | ICD-10-CM | POA: Diagnosis not present

## 2018-12-01 DIAGNOSIS — D224 Melanocytic nevi of scalp and neck: Secondary | ICD-10-CM | POA: Diagnosis not present

## 2018-12-09 ENCOUNTER — Ambulatory Visit: Payer: BC Managed Care – PPO | Admitting: Obstetrics & Gynecology

## 2018-12-14 ENCOUNTER — Ambulatory Visit: Payer: BC Managed Care – PPO | Admitting: Certified Nurse Midwife

## 2018-12-14 DIAGNOSIS — Z01419 Encounter for gynecological examination (general) (routine) without abnormal findings: Secondary | ICD-10-CM

## 2019-03-09 DIAGNOSIS — I10 Essential (primary) hypertension: Secondary | ICD-10-CM | POA: Diagnosis not present

## 2019-03-09 DIAGNOSIS — M545 Low back pain: Secondary | ICD-10-CM | POA: Diagnosis not present

## 2019-03-09 DIAGNOSIS — F419 Anxiety disorder, unspecified: Secondary | ICD-10-CM | POA: Diagnosis not present

## 2019-03-09 DIAGNOSIS — Z0000 Encounter for general adult medical examination without abnormal findings: Secondary | ICD-10-CM | POA: Diagnosis not present

## 2019-03-09 DIAGNOSIS — F324 Major depressive disorder, single episode, in partial remission: Secondary | ICD-10-CM | POA: Diagnosis not present

## 2019-05-25 ENCOUNTER — Ambulatory Visit: Payer: BC Managed Care – PPO | Admitting: Obstetrics and Gynecology

## 2019-06-01 ENCOUNTER — Ambulatory Visit: Payer: BC Managed Care – PPO | Admitting: Nurse Practitioner

## 2019-06-01 ENCOUNTER — Telehealth: Payer: Self-pay | Admitting: *Deleted

## 2019-06-01 NOTE — Telephone Encounter (Signed)
Returned call from 9:31 AM that patient is not going to have childcare for her appointment due to her husband not getting off in time. Left patient a message to call and reschedule.

## 2019-07-06 DIAGNOSIS — M545 Low back pain: Secondary | ICD-10-CM | POA: Diagnosis not present

## 2019-08-12 DIAGNOSIS — M5416 Radiculopathy, lumbar region: Secondary | ICD-10-CM | POA: Diagnosis not present

## 2019-08-12 DIAGNOSIS — M9901 Segmental and somatic dysfunction of cervical region: Secondary | ICD-10-CM | POA: Diagnosis not present

## 2019-08-12 DIAGNOSIS — M5136 Other intervertebral disc degeneration, lumbar region: Secondary | ICD-10-CM | POA: Diagnosis not present

## 2019-08-12 DIAGNOSIS — M5441 Lumbago with sciatica, right side: Secondary | ICD-10-CM | POA: Diagnosis not present

## 2019-08-16 DIAGNOSIS — M5441 Lumbago with sciatica, right side: Secondary | ICD-10-CM | POA: Diagnosis not present

## 2019-08-16 DIAGNOSIS — M5416 Radiculopathy, lumbar region: Secondary | ICD-10-CM | POA: Diagnosis not present

## 2019-08-16 DIAGNOSIS — M9901 Segmental and somatic dysfunction of cervical region: Secondary | ICD-10-CM | POA: Diagnosis not present

## 2019-08-16 DIAGNOSIS — M5136 Other intervertebral disc degeneration, lumbar region: Secondary | ICD-10-CM | POA: Diagnosis not present

## 2019-08-17 DIAGNOSIS — M5416 Radiculopathy, lumbar region: Secondary | ICD-10-CM | POA: Diagnosis not present

## 2019-08-17 DIAGNOSIS — M5441 Lumbago with sciatica, right side: Secondary | ICD-10-CM | POA: Diagnosis not present

## 2019-08-17 DIAGNOSIS — M9901 Segmental and somatic dysfunction of cervical region: Secondary | ICD-10-CM | POA: Diagnosis not present

## 2019-08-17 DIAGNOSIS — M5136 Other intervertebral disc degeneration, lumbar region: Secondary | ICD-10-CM | POA: Diagnosis not present

## 2019-08-19 DIAGNOSIS — M9901 Segmental and somatic dysfunction of cervical region: Secondary | ICD-10-CM | POA: Diagnosis not present

## 2019-08-19 DIAGNOSIS — M5416 Radiculopathy, lumbar region: Secondary | ICD-10-CM | POA: Diagnosis not present

## 2019-08-19 DIAGNOSIS — M5441 Lumbago with sciatica, right side: Secondary | ICD-10-CM | POA: Diagnosis not present

## 2019-08-19 DIAGNOSIS — M5136 Other intervertebral disc degeneration, lumbar region: Secondary | ICD-10-CM | POA: Diagnosis not present

## 2019-08-22 DIAGNOSIS — M9901 Segmental and somatic dysfunction of cervical region: Secondary | ICD-10-CM | POA: Diagnosis not present

## 2019-08-22 DIAGNOSIS — M5136 Other intervertebral disc degeneration, lumbar region: Secondary | ICD-10-CM | POA: Diagnosis not present

## 2019-08-22 DIAGNOSIS — M5441 Lumbago with sciatica, right side: Secondary | ICD-10-CM | POA: Diagnosis not present

## 2019-08-22 DIAGNOSIS — M5416 Radiculopathy, lumbar region: Secondary | ICD-10-CM | POA: Diagnosis not present

## 2019-08-23 DIAGNOSIS — M5441 Lumbago with sciatica, right side: Secondary | ICD-10-CM | POA: Diagnosis not present

## 2019-08-23 DIAGNOSIS — M5136 Other intervertebral disc degeneration, lumbar region: Secondary | ICD-10-CM | POA: Diagnosis not present

## 2019-08-23 DIAGNOSIS — M5416 Radiculopathy, lumbar region: Secondary | ICD-10-CM | POA: Diagnosis not present

## 2019-08-23 DIAGNOSIS — M9901 Segmental and somatic dysfunction of cervical region: Secondary | ICD-10-CM | POA: Diagnosis not present

## 2019-08-25 ENCOUNTER — Ambulatory Visit (INDEPENDENT_AMBULATORY_CARE_PROVIDER_SITE_OTHER): Payer: BC Managed Care – PPO | Admitting: Obstetrics and Gynecology

## 2019-08-25 ENCOUNTER — Other Ambulatory Visit: Payer: Self-pay

## 2019-08-25 ENCOUNTER — Encounter: Payer: Self-pay | Admitting: Obstetrics and Gynecology

## 2019-08-25 ENCOUNTER — Other Ambulatory Visit (HOSPITAL_COMMUNITY)
Admission: RE | Admit: 2019-08-25 | Discharge: 2019-08-25 | Disposition: A | Discharge status: Home / Self Care | Payer: BC Managed Care – PPO | Source: Ambulatory Visit | Attending: Obstetrics and Gynecology | Admitting: Obstetrics and Gynecology

## 2019-08-25 VITALS — BP 146/98 | HR 104 | Temp 98.8°F | Resp 16 | Ht 69.0 in | Wt 269.0 lb

## 2019-08-25 DIAGNOSIS — Z1231 Encounter for screening mammogram for malignant neoplasm of breast: Secondary | ICD-10-CM | POA: Diagnosis not present

## 2019-08-25 DIAGNOSIS — R8290 Unspecified abnormal findings in urine: Secondary | ICD-10-CM

## 2019-08-25 DIAGNOSIS — N939 Abnormal uterine and vaginal bleeding, unspecified: Secondary | ICD-10-CM | POA: Diagnosis not present

## 2019-08-25 DIAGNOSIS — Z01411 Encounter for gynecological examination (general) (routine) with abnormal findings: Secondary | ICD-10-CM

## 2019-08-25 DIAGNOSIS — Z01419 Encounter for gynecological examination (general) (routine) without abnormal findings: Secondary | ICD-10-CM | POA: Diagnosis not present

## 2019-08-25 DIAGNOSIS — R635 Abnormal weight gain: Secondary | ICD-10-CM

## 2019-08-25 DIAGNOSIS — N951 Menopausal and female climacteric states: Secondary | ICD-10-CM | POA: Diagnosis not present

## 2019-08-25 DIAGNOSIS — Z124 Encounter for screening for malignant neoplasm of cervix: Secondary | ICD-10-CM

## 2019-08-25 DIAGNOSIS — Z1151 Encounter for screening for human papillomavirus (HPV): Secondary | ICD-10-CM | POA: Insufficient documentation

## 2019-08-25 DIAGNOSIS — Z1272 Encounter for screening for malignant neoplasm of vagina: Secondary | ICD-10-CM

## 2019-08-25 NOTE — Progress Notes (Signed)
GYNECOLOGY ANNUAL PREVENTATIVE CARE ENCOUNTER NOTE  Subjective:   Kimberly [name] is a 44 y.o. G34P1001 female here for a annual gynecologic exam. Current complaints: urinary tract infections, heavy and painful periods. Also weight gain. Cramps are now 2 days where they used to be one. Periods are lasting a full week whereas they used to be 3-4 days. Also having extreme PMS symptoms, mood swings. Also having hot flushes.   Has gained about 60 pounds since birth of last child. Not exercising as much as she used to due to job and COVID. Denies abnormal discharge, pelvic pain, problems with intercourse or other gynecologic concerns.   Had UTI in January, was treated with antibiotics. Has had foul smelling urine since then.   Gynecologic History Patient's last menstrual period was 08/14/2019. Contraception: vasectomy Last Pap: 2017. Results were: normal Last mammogram: has never had DEXA: has never had  Obstetric History OB History  Gravida Para Term Preterm AB Living  1 1 1  0 0 1  SAB TAB Ectopic Multiple Live Births  0 0 0 0 1    # Outcome Date GA Lbr Len/2nd Weight Sex Delivery Anes PTL Lv  1 Term 12/18/16 [redacted]w[redacted]d 07:28 / 01:02 7 lb 3.7 oz (3.28 kg) F Vag-Spont EPI  LIV    Past Medical History:  Diagnosis Date  . Disc degeneration, lumbar   . Gallstones   . Pregnancy induced hypertension     Past Surgical History:  Procedure Laterality Date  . CHOLECYSTECTOMY      Current Outpatient Medications on File Prior to Visit  Medication Sig Dispense Refill  . amLODipine (NORVASC) 5 MG tablet Take 1 tablet (5 mg total) by mouth daily. 30 tablet 1  . DULoxetine (CYMBALTA) 60 MG capsule Take 60 mg by mouth 2 (two) times daily.    [redacted]w[redacted]d ibuprofen (ADVIL,MOTRIN) 600 MG tablet Take 1 tablet (600 mg total) by mouth every 6 (six) hours. 30 tablet 0  . Prenatal Vit-Fe Fumarate-FA (PRENATAL VITAMIN PO) Take 1 tablet by mouth daily.      No current facility-administered medications on file  prior to visit.    No Known Allergies  Social History   Socioeconomic History  . Marital status: Married    Spouse name: Not on file  . Number of children: Not on file  . Years of education: Not on file  . Highest education level: Not on file  Occupational History  . Not on file  Tobacco Use  . Smoking status: Never Smoker  . Smokeless tobacco: Never Used  Substance and Sexual Activity  . Alcohol use: Yes    Alcohol/week: 1.0 standard drinks    Types: 1 Standard drinks or equivalent per week  . Drug use: No  . Sexual activity: Yes    Partners: Male    Birth control/protection: None    Comment: Husband had Vasectomy  Other Topics Concern  . Not on file  Social History Narrative  . Not on file   Social Determinants of Health   Financial Resource Strain:   . Difficulty of Paying Living Expenses:   Food Insecurity:   . Worried About Marland Kitchen in the Last Year:   . Programme researcher, broadcasting/film/video in the Last Year:   Transportation Needs:   . Barista (Medical):   Freight forwarder Lack of Transportation (Non-Medical):   Physical Activity:   . Days of Exercise per Week:   . Minutes of Exercise per Session:   Stress:   .  Feeling of Stress :   Social Connections:   . Frequency of Communication with Friends and Family:   . Frequency of Social Gatherings with Friends and Family:   . Attends Religious Services:   . Active Member of Clubs or Organizations:   . Attends Banker Meetings:   Marland Kitchen Marital Status:   Intimate Partner Violence:   . Fear of Current or Ex-Partner:   . Emotionally Abused:   Marland Kitchen Physically Abused:   . Sexually Abused:     Family History  Problem Relation Age of Onset  . Heart disease Mother   . Hypertension Father   . Heart disease Father   . Hypertension Maternal Grandmother     The following portions of the patient's history were reviewed and updated as appropriate: allergies, current medications, past family history, past medical  history, past social history, past surgical history and problem list.  Review of Systems Pertinent items are noted in HPI.   Objective:  BP (!) 146/98   Pulse (!) 104   Temp 98.8 F (37.1 C)   Resp 16   Ht 5\' 9"  (1.753 m)   Wt 269 lb (122 kg)   LMP 08/14/2019   BMI 39.72 kg/m  CONSTITUTIONAL: Well-developed, well-nourished female in no acute distress.  HENT:  Normocephalic, atraumatic, External right and left ear normal. Oropharynx is clear and moist EYES: Conjunctivae and EOM are normal. Pupils are equal, round, and reactive to light. No scleral icterus.  NECK: Normal range of motion, supple, no masses.  Normal thyroid.  SKIN: Skin is warm and dry. No rash noted. Not diaphoretic. No erythema. No pallor. NEUROLOGIC: Alert and oriented to person, place, and time. Normal reflexes, muscle tone coordination. No cranial nerve deficit noted. PSYCHIATRIC: Normal mood and affect. Normal behavior. Normal judgment and thought content. CARDIOVASCULAR: Normal heart rate noted, regular rhythm RESPIRATORY: Clear to auscultation bilaterally. Effort and breath sounds normal, no problems with respiration noted. BREASTS: Symmetric in size. No masses, skin changes, nipple drainage, or lymphadenopathy. ABDOMEN: Soft, normal bowel sounds, no distention noted.  No tenderness, rebound or guarding.  PELVIC: Normal appearing external genitalia; normal appearing vaginal mucosa and cervix.  No abnormal discharge noted.  Pap smear obtained.  Normal uterine size, no other palpable masses, no uterine or adnexal tenderness. MUSCULOSKELETAL: Normal range of motion. No tenderness.  No cyanosis, clubbing, or edema.  2+ distal pulses.  Exam done with chaperone present.  FRAX score done 08/25/19: BMI: 39.8 The ten year probability of fracture (%) without BMD  Major osteoporotic: 1.3 Hip Fracture: 0.0  Assessment and Plan:   1. Well woman exam with routine gynecological exam - Cytology - PAP( [facility]) - [place]  Digital Screening; Future  2. Cervical cancer screening Pap today  3. Encounter for screening mammogram for malignant neoplasm of breast Mammogram today  4. Abnormal uterine bleeding (AUB) Likely secondary to age however will see if there is hormonal reason - may start management as needed  5. Hot flushes, perimenopausal - Follicle stimulating hormone  6. Weight gain - declines referral to nutrition today - HgB A1c - TSH - Lipid panel - CBC  7. Malodorous urine - Urine Culture  Will follow up results of pap smear and manage accordingly. Encouraged improvement in diet and exercise.  Mammogram ordered today Referral for colonoscopy n/a DEXA not due based on /FRAXage  Routine preventative health maintenance measures emphasized. Please refer to After Visit Summary for other counseling recommendations.   Total face-to-face time with  patient: 30 minutes. Over 50% of encounter was spent on counseling and coordination of care.   Feliz Beam, M.D. Attending Center for [employer] Fish farm manager)

## 2019-08-26 DIAGNOSIS — M5441 Lumbago with sciatica, right side: Secondary | ICD-10-CM | POA: Diagnosis not present

## 2019-08-26 DIAGNOSIS — M5136 Other intervertebral disc degeneration, lumbar region: Secondary | ICD-10-CM | POA: Diagnosis not present

## 2019-08-26 DIAGNOSIS — M5416 Radiculopathy, lumbar region: Secondary | ICD-10-CM | POA: Diagnosis not present

## 2019-08-26 DIAGNOSIS — M9901 Segmental and somatic dysfunction of cervical region: Secondary | ICD-10-CM | POA: Diagnosis not present

## 2019-08-26 LAB — CYTOLOGY - PAP
Adequacy: ABSENT
Comment: NEGATIVE
Diagnosis: NEGATIVE
High risk HPV: POSITIVE — AB

## 2019-08-26 LAB — FOLLICLE STIMULATING HORMONE: FSH: 20.8 m[IU]/mL

## 2019-08-27 LAB — LIPID PANEL
Cholesterol: 148 mg/dL (ref <200)
HDL: 40 mg/dL — ABNORMAL LOW (ref >=50)
LDL Cholesterol (Calc): 74 mg/dL (calc)
Non-HDL Cholesterol (Calc): 108 mg/dL (calc) (ref <130)
Total CHOL/HDL Ratio: 3.7 (calc) (ref <5.0)
Triglycerides: 258 mg/dL — ABNORMAL HIGH (ref <150)

## 2019-08-27 LAB — CBC
HCT: 37.2 % (ref 35.0–45.0)
Hemoglobin: 12.9 g/dL (ref 11.7–15.5)
MCH: 33 pg (ref 27.0–33.0)
MCHC: 34.7 g/dL (ref 32.0–36.0)
MCV: 95.1 fL (ref 80.0–100.0)
MPV: 9.9 fL (ref 7.5–12.5)
Platelets: 326 10*3/uL (ref 140–400)
RBC: 3.91 10*6/uL (ref 3.80–5.10)
RDW: 12 % (ref 11.0–15.0)
WBC: 9.4 10*3/uL (ref 3.8–10.8)

## 2019-08-27 LAB — HEMOGLOBIN A1C
Hgb A1c MFr Bld: 5.4 % of total Hgb (ref <5.7)
Mean Plasma Glucose: 108 (calc)
eAG (mmol/L): 6 (calc)

## 2019-08-27 LAB — URINE CULTURE
MICRO NUMBER:: 10474973
SPECIMEN QUALITY:: ADEQUATE

## 2019-08-27 LAB — TSH: TSH: 1.64 mIU/L

## 2019-08-28 MED ORDER — NITROFURANTOIN MONOHYD MACRO 100 MG PO CAPS: 100.0000 mg | ORAL_CAPSULE | Freq: Two times a day (BID) | ORAL | 1 refills | Status: DC

## 2019-08-28 NOTE — Addendum Note (Signed)
Addended by: Leroy Libman on: 08/28/2019 04:22 PM   Modules accepted: Orders

## 2019-09-05 DIAGNOSIS — M5416 Radiculopathy, lumbar region: Secondary | ICD-10-CM | POA: Diagnosis not present

## 2019-09-05 DIAGNOSIS — M9901 Segmental and somatic dysfunction of cervical region: Secondary | ICD-10-CM | POA: Diagnosis not present

## 2019-09-05 DIAGNOSIS — M5441 Lumbago with sciatica, right side: Secondary | ICD-10-CM | POA: Diagnosis not present

## 2019-09-05 DIAGNOSIS — M5136 Other intervertebral disc degeneration, lumbar region: Secondary | ICD-10-CM | POA: Diagnosis not present

## 2019-09-28 DIAGNOSIS — I10 Essential (primary) hypertension: Secondary | ICD-10-CM | POA: Diagnosis not present

## 2019-09-28 DIAGNOSIS — F324 Major depressive disorder, single episode, in partial remission: Secondary | ICD-10-CM | POA: Diagnosis not present

## 2019-09-28 DIAGNOSIS — F419 Anxiety disorder, unspecified: Secondary | ICD-10-CM | POA: Diagnosis not present

## 2019-09-28 DIAGNOSIS — M545 Low back pain: Secondary | ICD-10-CM | POA: Diagnosis not present

## 2019-10-25 DIAGNOSIS — M9903 Segmental and somatic dysfunction of lumbar region: Secondary | ICD-10-CM | POA: Diagnosis not present

## 2019-10-25 DIAGNOSIS — M9905 Segmental and somatic dysfunction of pelvic region: Secondary | ICD-10-CM | POA: Diagnosis not present

## 2019-10-25 DIAGNOSIS — M5116 Intervertebral disc disorders with radiculopathy, lumbar region: Secondary | ICD-10-CM | POA: Diagnosis not present

## 2019-10-25 DIAGNOSIS — M25551 Pain in right hip: Secondary | ICD-10-CM | POA: Diagnosis not present

## 2019-10-27 DIAGNOSIS — M9905 Segmental and somatic dysfunction of pelvic region: Secondary | ICD-10-CM | POA: Diagnosis not present

## 2019-10-27 DIAGNOSIS — M9903 Segmental and somatic dysfunction of lumbar region: Secondary | ICD-10-CM | POA: Diagnosis not present

## 2019-10-27 DIAGNOSIS — M5116 Intervertebral disc disorders with radiculopathy, lumbar region: Secondary | ICD-10-CM | POA: Diagnosis not present

## 2019-10-27 DIAGNOSIS — M25551 Pain in right hip: Secondary | ICD-10-CM | POA: Diagnosis not present

## 2019-10-31 DIAGNOSIS — M25551 Pain in right hip: Secondary | ICD-10-CM | POA: Diagnosis not present

## 2019-10-31 DIAGNOSIS — M5116 Intervertebral disc disorders with radiculopathy, lumbar region: Secondary | ICD-10-CM | POA: Diagnosis not present

## 2019-10-31 DIAGNOSIS — M9903 Segmental and somatic dysfunction of lumbar region: Secondary | ICD-10-CM | POA: Diagnosis not present

## 2019-10-31 DIAGNOSIS — M9905 Segmental and somatic dysfunction of pelvic region: Secondary | ICD-10-CM | POA: Diagnosis not present

## 2019-11-01 DIAGNOSIS — M5116 Intervertebral disc disorders with radiculopathy, lumbar region: Secondary | ICD-10-CM | POA: Diagnosis not present

## 2019-11-01 DIAGNOSIS — M9905 Segmental and somatic dysfunction of pelvic region: Secondary | ICD-10-CM | POA: Diagnosis not present

## 2019-11-01 DIAGNOSIS — M25551 Pain in right hip: Secondary | ICD-10-CM | POA: Diagnosis not present

## 2019-11-01 DIAGNOSIS — M9903 Segmental and somatic dysfunction of lumbar region: Secondary | ICD-10-CM | POA: Diagnosis not present

## 2019-11-03 DIAGNOSIS — M9903 Segmental and somatic dysfunction of lumbar region: Secondary | ICD-10-CM | POA: Diagnosis not present

## 2019-11-03 DIAGNOSIS — M5116 Intervertebral disc disorders with radiculopathy, lumbar region: Secondary | ICD-10-CM | POA: Diagnosis not present

## 2019-11-03 DIAGNOSIS — M9905 Segmental and somatic dysfunction of pelvic region: Secondary | ICD-10-CM | POA: Diagnosis not present

## 2019-11-03 DIAGNOSIS — M25551 Pain in right hip: Secondary | ICD-10-CM | POA: Diagnosis not present

## 2019-11-07 DIAGNOSIS — M9903 Segmental and somatic dysfunction of lumbar region: Secondary | ICD-10-CM | POA: Diagnosis not present

## 2019-11-07 DIAGNOSIS — M9905 Segmental and somatic dysfunction of pelvic region: Secondary | ICD-10-CM | POA: Diagnosis not present

## 2019-11-07 DIAGNOSIS — M5116 Intervertebral disc disorders with radiculopathy, lumbar region: Secondary | ICD-10-CM | POA: Diagnosis not present

## 2019-11-07 DIAGNOSIS — M25551 Pain in right hip: Secondary | ICD-10-CM | POA: Diagnosis not present

## 2019-11-08 DIAGNOSIS — M9905 Segmental and somatic dysfunction of pelvic region: Secondary | ICD-10-CM | POA: Diagnosis not present

## 2019-11-08 DIAGNOSIS — M25551 Pain in right hip: Secondary | ICD-10-CM | POA: Diagnosis not present

## 2019-11-08 DIAGNOSIS — M9903 Segmental and somatic dysfunction of lumbar region: Secondary | ICD-10-CM | POA: Diagnosis not present

## 2019-11-08 DIAGNOSIS — M5116 Intervertebral disc disorders with radiculopathy, lumbar region: Secondary | ICD-10-CM | POA: Diagnosis not present

## 2019-11-10 DIAGNOSIS — M9905 Segmental and somatic dysfunction of pelvic region: Secondary | ICD-10-CM | POA: Diagnosis not present

## 2019-11-10 DIAGNOSIS — M25551 Pain in right hip: Secondary | ICD-10-CM | POA: Diagnosis not present

## 2019-11-10 DIAGNOSIS — M9903 Segmental and somatic dysfunction of lumbar region: Secondary | ICD-10-CM | POA: Diagnosis not present

## 2019-11-10 DIAGNOSIS — M5116 Intervertebral disc disorders with radiculopathy, lumbar region: Secondary | ICD-10-CM | POA: Diagnosis not present

## 2019-11-14 DIAGNOSIS — M25551 Pain in right hip: Secondary | ICD-10-CM | POA: Diagnosis not present

## 2019-11-14 DIAGNOSIS — M9903 Segmental and somatic dysfunction of lumbar region: Secondary | ICD-10-CM | POA: Diagnosis not present

## 2019-11-14 DIAGNOSIS — M9905 Segmental and somatic dysfunction of pelvic region: Secondary | ICD-10-CM | POA: Diagnosis not present

## 2019-11-14 DIAGNOSIS — M5116 Intervertebral disc disorders with radiculopathy, lumbar region: Secondary | ICD-10-CM | POA: Diagnosis not present

## 2019-11-16 DIAGNOSIS — M9903 Segmental and somatic dysfunction of lumbar region: Secondary | ICD-10-CM | POA: Diagnosis not present

## 2019-11-16 DIAGNOSIS — M25551 Pain in right hip: Secondary | ICD-10-CM | POA: Diagnosis not present

## 2019-11-16 DIAGNOSIS — M5116 Intervertebral disc disorders with radiculopathy, lumbar region: Secondary | ICD-10-CM | POA: Diagnosis not present

## 2019-11-16 DIAGNOSIS — M9905 Segmental and somatic dysfunction of pelvic region: Secondary | ICD-10-CM | POA: Diagnosis not present

## 2019-11-17 DIAGNOSIS — M5116 Intervertebral disc disorders with radiculopathy, lumbar region: Secondary | ICD-10-CM | POA: Diagnosis not present

## 2019-11-17 DIAGNOSIS — M25551 Pain in right hip: Secondary | ICD-10-CM | POA: Diagnosis not present

## 2019-11-17 DIAGNOSIS — M9905 Segmental and somatic dysfunction of pelvic region: Secondary | ICD-10-CM | POA: Diagnosis not present

## 2019-11-17 DIAGNOSIS — M9903 Segmental and somatic dysfunction of lumbar region: Secondary | ICD-10-CM | POA: Diagnosis not present

## 2019-11-21 DIAGNOSIS — M25551 Pain in right hip: Secondary | ICD-10-CM | POA: Diagnosis not present

## 2019-11-21 DIAGNOSIS — M9905 Segmental and somatic dysfunction of pelvic region: Secondary | ICD-10-CM | POA: Diagnosis not present

## 2019-11-21 DIAGNOSIS — M9903 Segmental and somatic dysfunction of lumbar region: Secondary | ICD-10-CM | POA: Diagnosis not present

## 2019-11-21 DIAGNOSIS — M5116 Intervertebral disc disorders with radiculopathy, lumbar region: Secondary | ICD-10-CM | POA: Diagnosis not present

## 2019-11-23 DIAGNOSIS — M9903 Segmental and somatic dysfunction of lumbar region: Secondary | ICD-10-CM | POA: Diagnosis not present

## 2019-11-23 DIAGNOSIS — M9905 Segmental and somatic dysfunction of pelvic region: Secondary | ICD-10-CM | POA: Diagnosis not present

## 2019-11-23 DIAGNOSIS — M25551 Pain in right hip: Secondary | ICD-10-CM | POA: Diagnosis not present

## 2019-11-23 DIAGNOSIS — M5116 Intervertebral disc disorders with radiculopathy, lumbar region: Secondary | ICD-10-CM | POA: Diagnosis not present

## 2019-11-28 DIAGNOSIS — M9903 Segmental and somatic dysfunction of lumbar region: Secondary | ICD-10-CM | POA: Diagnosis not present

## 2019-11-28 DIAGNOSIS — M9905 Segmental and somatic dysfunction of pelvic region: Secondary | ICD-10-CM | POA: Diagnosis not present

## 2019-11-28 DIAGNOSIS — M5116 Intervertebral disc disorders with radiculopathy, lumbar region: Secondary | ICD-10-CM | POA: Diagnosis not present

## 2019-11-28 DIAGNOSIS — M25551 Pain in right hip: Secondary | ICD-10-CM | POA: Diagnosis not present

## 2019-11-30 DIAGNOSIS — M9903 Segmental and somatic dysfunction of lumbar region: Secondary | ICD-10-CM | POA: Diagnosis not present

## 2019-11-30 DIAGNOSIS — M5116 Intervertebral disc disorders with radiculopathy, lumbar region: Secondary | ICD-10-CM | POA: Diagnosis not present

## 2019-11-30 DIAGNOSIS — M25551 Pain in right hip: Secondary | ICD-10-CM | POA: Diagnosis not present

## 2019-11-30 DIAGNOSIS — M9905 Segmental and somatic dysfunction of pelvic region: Secondary | ICD-10-CM | POA: Diagnosis not present

## 2019-12-05 DIAGNOSIS — M5116 Intervertebral disc disorders with radiculopathy, lumbar region: Secondary | ICD-10-CM | POA: Diagnosis not present

## 2019-12-05 DIAGNOSIS — M25551 Pain in right hip: Secondary | ICD-10-CM | POA: Diagnosis not present

## 2019-12-05 DIAGNOSIS — M9905 Segmental and somatic dysfunction of pelvic region: Secondary | ICD-10-CM | POA: Diagnosis not present

## 2019-12-05 DIAGNOSIS — M9903 Segmental and somatic dysfunction of lumbar region: Secondary | ICD-10-CM | POA: Diagnosis not present

## 2019-12-07 DIAGNOSIS — M5116 Intervertebral disc disorders with radiculopathy, lumbar region: Secondary | ICD-10-CM | POA: Diagnosis not present

## 2019-12-07 DIAGNOSIS — M9905 Segmental and somatic dysfunction of pelvic region: Secondary | ICD-10-CM | POA: Diagnosis not present

## 2019-12-07 DIAGNOSIS — M25551 Pain in right hip: Secondary | ICD-10-CM | POA: Diagnosis not present

## 2019-12-07 DIAGNOSIS — M9903 Segmental and somatic dysfunction of lumbar region: Secondary | ICD-10-CM | POA: Diagnosis not present

## 2019-12-13 DIAGNOSIS — M9905 Segmental and somatic dysfunction of pelvic region: Secondary | ICD-10-CM | POA: Diagnosis not present

## 2019-12-13 DIAGNOSIS — M25551 Pain in right hip: Secondary | ICD-10-CM | POA: Diagnosis not present

## 2019-12-13 DIAGNOSIS — M5116 Intervertebral disc disorders with radiculopathy, lumbar region: Secondary | ICD-10-CM | POA: Diagnosis not present

## 2019-12-13 DIAGNOSIS — M9903 Segmental and somatic dysfunction of lumbar region: Secondary | ICD-10-CM | POA: Diagnosis not present

## 2019-12-15 DIAGNOSIS — M5116 Intervertebral disc disorders with radiculopathy, lumbar region: Secondary | ICD-10-CM | POA: Diagnosis not present

## 2019-12-15 DIAGNOSIS — M9905 Segmental and somatic dysfunction of pelvic region: Secondary | ICD-10-CM | POA: Diagnosis not present

## 2019-12-15 DIAGNOSIS — M9903 Segmental and somatic dysfunction of lumbar region: Secondary | ICD-10-CM | POA: Diagnosis not present

## 2019-12-15 DIAGNOSIS — M25551 Pain in right hip: Secondary | ICD-10-CM | POA: Diagnosis not present

## 2019-12-27 DIAGNOSIS — M9903 Segmental and somatic dysfunction of lumbar region: Secondary | ICD-10-CM | POA: Diagnosis not present

## 2019-12-27 DIAGNOSIS — M9905 Segmental and somatic dysfunction of pelvic region: Secondary | ICD-10-CM | POA: Diagnosis not present

## 2019-12-27 DIAGNOSIS — M5116 Intervertebral disc disorders with radiculopathy, lumbar region: Secondary | ICD-10-CM | POA: Diagnosis not present

## 2019-12-27 DIAGNOSIS — M25551 Pain in right hip: Secondary | ICD-10-CM | POA: Diagnosis not present

## 2020-02-08 ENCOUNTER — Ambulatory Visit (INDEPENDENT_AMBULATORY_CARE_PROVIDER_SITE_OTHER): Payer: BC Managed Care – PPO

## 2020-02-08 ENCOUNTER — Other Ambulatory Visit: Payer: Self-pay

## 2020-02-08 DIAGNOSIS — Z01419 Encounter for gynecological examination (general) (routine) without abnormal findings: Secondary | ICD-10-CM

## 2020-02-08 DIAGNOSIS — Z1231 Encounter for screening mammogram for malignant neoplasm of breast: Secondary | ICD-10-CM | POA: Diagnosis not present

## 2020-12-23 ENCOUNTER — Telehealth: Payer: Managed Care, Other (non HMO) | Admitting: Nurse Practitioner

## 2020-12-23 DIAGNOSIS — U071 COVID-19: Secondary | ICD-10-CM | POA: Diagnosis not present

## 2020-12-23 MED ORDER — MOLNUPIRAVIR EUA 200MG CAPSULE: 4.0000 | ORAL_CAPSULE | Freq: Two times a day (BID) | ORAL | 0 refills | Status: AC

## 2020-12-23 MED ORDER — FLUTICASONE PROPIONATE 50 MCG/ACT NA SUSP: 2.0000 | Freq: Every day | NASAL | 0 refills | Status: DC

## 2020-12-23 NOTE — Progress Notes (Signed)
Virtual Visit Consent   Kimberly [name], you are scheduled for a virtual visit with a [facility] provider today.     Just as with appointments in the office, your consent must be obtained to participate.  Your consent will be active for this visit and any virtual visit you may have with one of our providers in the next 365 days.     If you have a MyChart account, a copy of this consent can be sent to you electronically.  All virtual visits are billed to your insurance company just like a traditional visit in the office.    As this is a virtual visit, video technology does not allow for your provider to perform a traditional examination.  This may limit your provider's ability to fully assess your condition.  If your provider identifies any concerns that need to be evaluated in person or the need to arrange testing (such as labs, EKG, etc.), we will make arrangements to do so.     Although advances in technology are sophisticated, we cannot ensure that it will always work on either your end or our end.  If the connection with a video visit is poor, the visit may have to be switched to a telephone visit.  With either a video or telephone visit, we are not always able to ensure that we have a secure connection.     I need to obtain your verbal consent now.   Are you willing to proceed with your visit today? Yes   Kimberly [name] has provided verbal consent on 12/23/2020 for a virtual visit (video or telephone).   Nicoletta Ba, NP   Date: 12/23/2020 11:53 AM   Virtual Visit via Video Note   I, Nicoletta Ba, connected with  Kimberly [name]  ([id number], May 05, 1975) on 12/23/20 at 12:00 PM EDT by a video-enabled telemedicine application and verified that I am speaking with the correct person using two identifiers.  Location: Patient: Virtual Visit Location Patient: Home Provider: Virtual Visit Location Provider: Home Office   I discussed the limitations of evaluation and management by  telemedicine and the availability of in person appointments. The patient expressed understanding and agreed to proceed.    History of Present Illness: Kimberly [name] is a 45 y.o. who identifies as a female who was assigned female at birth, and is being seen today for positive COVID test. Patient informs she tested positive on 9/10. Complains of nasal congestion and runny nose. Denies fever, chills, HA, nausea, vomiting, cough, SOB or wheezing. Had COVID in May, 2022, was prescribed Paxlovid at that time.  HPI: HPI  Problems:  Patient Active Problem List   Diagnosis Date Noted   History of severe pre-eclampsia 01/15/2017    Allergies: No Known Allergies Medications:  Current Outpatient Medications:    amLODipine (NORVASC) 5 MG tablet, Take 1 tablet (5 mg total) by mouth daily., Disp: 30 tablet, Rfl: 1   DULoxetine (CYMBALTA) 60 MG capsule, Take 60 mg by mouth 2 (two) times daily., Disp: , Rfl:    ibuprofen (ADVIL,MOTRIN) 600 MG tablet, Take 1 tablet (600 mg total) by mouth every 6 (six) hours., Disp: 30 tablet, Rfl: 0   nitrofurantoin, macrocrystal-monohydrate, (MACROBID) 100 MG capsule, Take 1 capsule (100 mg total) by mouth 2 (two) times daily., Disp: 14 capsule, Rfl: 1   Prenatal Vit-Fe Fumarate-FA (PRENATAL VITAMIN PO), Take 1 tablet by mouth daily. , Disp: , Rfl:   Observations/Objective: Patient is well-developed, well-nourished in no acute  distress.  Resting comfortably at home.  Head is normocephalic, atraumatic.  No labored breathing.  Speech is clear and coherent with logical content.  Patient is alert and oriented at baseline.   Assessment and Plan: 1. COVID-19 - molnupiravir EUA 200 mg CAPS; Take 4 capsules (800 mg total) by mouth 2 (two) times daily for 5 days.  Dispense: 40 capsule; Refill: 0 - fluticasone (FLONASE) 50 MCG/ACT nasal spray; Place 2 sprays into both nostrils daily for 10 days.  Dispense: 16 g; Refill: 0  Will start antiviral for COVID symptoms. Patient  does not have recent labwork. Instructions provided for medication administration.  Increase fluids, get plenty of rest. Patient was encouraged to follow-up with PCP this week if symptoms worsen. Go to ER if difficulty breathing, SOB, or other concerns.  Follow Up Instructions: I discussed the assessment and treatment plan with the patient. The patient was provided an opportunity to ask questions and all were answered. The patient agreed with the plan and demonstrated an understanding of the instructions.  A copy of instructions were sent to the patient via MyChart.  The patient was advised to call back or seek an in-person evaluation if the symptoms worsen or if the condition fails to improve as anticipated.  Time:  I spent 15 minutes with the patient via telehealth technology discussing the above problems/concerns.    Nicoletta Ba, NP

## 2020-12-23 NOTE — Patient Instructions (Signed)
  Kimberly [name], thank you for joining Nicoletta Ba, NP for today's virtual visit.  While this provider is not your primary care provider (PCP), if your PCP is located in our provider database this encounter information will be shared with them immediately following your visit.  Consent: (Patient) Kimberly [name] provided verbal consent for this virtual visit at the beginning of the encounter.  Current Medications:  Current Outpatient Medications:    amLODipine (NORVASC) 5 MG tablet, Take 1 tablet (5 mg total) by mouth daily., Disp: 30 tablet, Rfl: 1   DULoxetine (CYMBALTA) 60 MG capsule, Take 60 mg by mouth 2 (two) times daily., Disp: , Rfl:    fluticasone (FLONASE) 50 MCG/ACT nasal spray, Place 2 sprays into both nostrils daily for 10 days., Disp: 16 g, Rfl: 0   ibuprofen (ADVIL,MOTRIN) 600 MG tablet, Take 1 tablet (600 mg total) by mouth every 6 (six) hours., Disp: 30 tablet, Rfl: 0   molnupiravir EUA 200 mg CAPS, Take 4 capsules (800 mg total) by mouth 2 (two) times daily for 5 days., Disp: 40 capsule, Rfl: 0   nitrofurantoin, macrocrystal-monohydrate, (MACROBID) 100 MG capsule, Take 1 capsule (100 mg total) by mouth 2 (two) times daily., Disp: 14 capsule, Rfl: 1   Prenatal Vit-Fe Fumarate-FA (PRENATAL VITAMIN PO), Take 1 tablet by mouth daily. , Disp: , Rfl:    Medications ordered in this encounter:  Meds ordered this encounter  Medications   molnupiravir EUA 200 mg CAPS    Sig: Take 4 capsules (800 mg total) by mouth 2 (two) times daily for 5 days.    Dispense:  40 capsule    Refill:  0   fluticasone (FLONASE) 50 MCG/ACT nasal spray    Sig: Place 2 sprays into both nostrils daily for 10 days.    Dispense:  16 g    Refill:  0     *If you need refills on other medications prior to your next appointment, please contact your pharmacy*  Follow-Up: Call back or seek an in-person evaluation if the symptoms worsen or if the condition fails to improve as anticipated.  Other  Instructions  Will start antiviral for COVID symptoms. Patient does not have recent labwork. Instructions provided for medication administration.  Increase fluids, get plenty of rest. Encouraged to follow-up with PCP this week if symptoms do not improve.  Go to ER if difficulty breathing, SOB, or other concerns.  If you have been instructed to have an in-person evaluation today at a local Urgent Care facility, please use the link below. It will take you to a list of all of our available [facility], including address, phone number and hours of operation. Please do not delay care.  [facility]  If you or a family member do not have a primary care provider, use the link below to schedule a visit and establish care. When you choose a [facility] primary care physician or advanced practice provider, you gain a long-term partner in health. Find a Primary Care Provider  Learn more about [facility] in-office and virtual care options: [facility] - Get Care Now

## 2021-03-21 ENCOUNTER — Telehealth: Payer: Self-pay | Admitting: *Deleted

## 2021-03-21 NOTE — Telephone Encounter (Signed)
Left patient a detailed message about the office canceling scheduled New GYN appointment on 05/27/2021 due to patient being an established patient. Patient advised to call or MyChart message to reschedule.

## 2021-05-01 ENCOUNTER — Other Ambulatory Visit: Payer: Self-pay | Admitting: Family Medicine

## 2021-05-01 ENCOUNTER — Other Ambulatory Visit: Payer: Self-pay

## 2021-05-01 ENCOUNTER — Ambulatory Visit (INDEPENDENT_AMBULATORY_CARE_PROVIDER_SITE_OTHER): Payer: Managed Care, Other (non HMO)

## 2021-05-01 DIAGNOSIS — Z1231 Encounter for screening mammogram for malignant neoplasm of breast: Secondary | ICD-10-CM | POA: Diagnosis not present

## 2021-05-27 ENCOUNTER — Encounter: Payer: Managed Care, Other (non HMO) | Admitting: Obstetrics & Gynecology

## 2021-10-08 DIAGNOSIS — K219 Gastro-esophageal reflux disease without esophagitis: Secondary | ICD-10-CM | POA: Insufficient documentation

## 2021-10-08 DIAGNOSIS — Z6841 Body Mass Index (BMI) 40.0 and over, adult: Secondary | ICD-10-CM | POA: Insufficient documentation

## 2021-10-08 DIAGNOSIS — F419 Anxiety disorder, unspecified: Secondary | ICD-10-CM | POA: Insufficient documentation

## 2021-10-08 DIAGNOSIS — I10 Essential (primary) hypertension: Secondary | ICD-10-CM | POA: Insufficient documentation

## 2021-10-08 DIAGNOSIS — F324 Major depressive disorder, single episode, in partial remission: Secondary | ICD-10-CM | POA: Insufficient documentation

## 2021-10-08 DIAGNOSIS — G4700 Insomnia, unspecified: Secondary | ICD-10-CM | POA: Insufficient documentation

## 2021-10-08 DIAGNOSIS — G43909 Migraine, unspecified, not intractable, without status migrainosus: Secondary | ICD-10-CM | POA: Insufficient documentation

## 2021-10-08 NOTE — Progress Notes (Signed)
ANNUAL EXAM Patient name: Kimberly [name] MRN [id number]  Date of birth: [birth year] Chief Complaint:   Annual Exam  History of Present Illness:   Kimberly [name] is a 46 y.o. G76P1001 female being seen today for a routine annual exam.   Current complaints: weight gain, mood swings (despite cymbalta) and hot all the time. She knows some weight gain due to dietary changes, but she feels like there is something else contributing. She thinks her mom went through menopause in her later 66s. She also notes HTN that has worsened acutely this year such that she is on 3 meds and still doesn't feel well controlled. She had TSH testing at her doctors office in Feb and it was normal. She also skipped a period in May which is the first time she has done that. She also reports low sex drive especially as she has been placed on medications.   Patient's last menstrual period was 09/25/2021.  She is married and sexually active with her spouse - he had a vasectomy. She [facility] and has her daughter at home.   Last pap 08/2019. Results were: NILM w/ HRHPV positive: type not specified. H/O abnormal pap: not prior to the 2021 pap  Review of Systems:   Pertinent items are noted in HPI Denies any headaches, blurred vision, fatigue, shortness of breath, chest pain, abdominal pain, abnormal vaginal discharge/itching/odor/irritation, bowel movements, urination, or intercourse unless otherwise stated above.  Pertinent History Reviewed:  Reviewed past medical,surgical, social and family history.  Reviewed problem list, medications and allergies. Physical Assessment:   Vitals:   10/10/21 1324  BP: 137/88  Pulse: (!) 103  Weight: 271 lb (122.9 kg)  Height: 5\' 9"  (1.753 m)  Body mass index is 40.02 kg/m.   Physical Examination:  General appearance - well appearing, and in no distress Mental status - alert, oriented to person, place, and time Psych:  She has a normal mood and affect Skin - warm and dry, normal color,  no suspicious lesions noted Chest - effort normal, all lung fields clear to auscultation bilaterally Heart - normal rate and regular rhythm Neck:  midline trachea, no thyromegaly or nodules Breasts - breasts appear normal, no suspicious masses, no skin or nipple changes or axillary nodes Abdomen - soft, nontender, nondistended, no masses or organomegaly Pelvic -  VULVA: normal appearing vulva with no masses, tenderness or lesions  VAGINA: normal appearing vagina with normal color and discharge, no lesions   CERVIX: normal appearing cervix without discharge or lesions, no CMT UTERUS: uterus is felt to be normal size, shape, consistency and nontender  ADNEXA: No adnexal masses or tenderness noted. Extremities:  No swelling or varicosities noted  Chaperone present for exam  No results found for this or any previous visit (from the past 24 hour(s)).  Assessment & Plan:  Kimberly [name] was seen today for annual exam.  Diagnoses and all orders for this visit:  Encounter for annual routine gynecological examination - Cervical cancer screening: Discussed guidelines. Pap with HPV wnl 08/2019 - Gardasil: Has not yet had. Not covered by her insurance.  - STD Testing: not indicated - Birth Control: Vasectomy - Breast Health: Encouraged self breast awareness/SBE. Teaching provided. Discussed limits of clinical breast exam for detecting breast cancer. WNL 04/21/2021 - F/U 12 months and prn  Hypertension, unspecified type Unusual to need 3 meds even with weight gain at this age and being pre or perimenopausal. I think given her constellation of sympotms worth being worked up for  secondary causes of HTN. Will refer to renal.   Weight gain Could be due to combination of factors and this is most likely. Reviewed impact of declining muscle mass, perimenopause and dietary choices.  Reviewed weight loss medications but we both agree on using these as last resort.   Hot flashes Could do HRT to help control  symptoms. Reviewed risks of breast cancer and VTE. If work up with renal is negative, she will notify me and then we can discuss decision about whether or not to try these medications.   No orders of the defined types were placed in this encounter.   Meds: No orders of the defined types were placed in this encounter.   Follow-up: No follow-ups on file.  Milas Hock, MD 10/10/2021 2:16 PM

## 2021-10-10 ENCOUNTER — Ambulatory Visit (INDEPENDENT_AMBULATORY_CARE_PROVIDER_SITE_OTHER): Payer: Managed Care, Other (non HMO) | Admitting: Obstetrics and Gynecology

## 2021-10-10 ENCOUNTER — Encounter: Payer: Self-pay | Admitting: Obstetrics and Gynecology

## 2021-10-10 VITALS — BP 137/88 | HR 103 | Ht 69.0 in | Wt 271.0 lb

## 2021-10-10 DIAGNOSIS — R635 Abnormal weight gain: Secondary | ICD-10-CM | POA: Diagnosis not present

## 2021-10-10 DIAGNOSIS — I10 Essential (primary) hypertension: Secondary | ICD-10-CM | POA: Diagnosis not present

## 2021-10-10 DIAGNOSIS — R232 Flushing: Secondary | ICD-10-CM

## 2021-10-10 DIAGNOSIS — Z01419 Encounter for gynecological examination (general) (routine) without abnormal findings: Secondary | ICD-10-CM | POA: Diagnosis not present

## 2021-10-30 ENCOUNTER — Encounter: Payer: Self-pay | Admitting: Obstetrics and Gynecology

## 2022-09-15 ENCOUNTER — Other Ambulatory Visit: Payer: Self-pay

## 2022-09-15 ENCOUNTER — Other Ambulatory Visit: Payer: Self-pay | Admitting: Family Medicine

## 2022-09-15 ENCOUNTER — Other Ambulatory Visit: Payer: Self-pay | Admitting: Obstetrics and Gynecology

## 2022-09-15 DIAGNOSIS — Z1231 Encounter for screening mammogram for malignant neoplasm of breast: Secondary | ICD-10-CM

## 2022-09-17 ENCOUNTER — Ambulatory Visit (INDEPENDENT_AMBULATORY_CARE_PROVIDER_SITE_OTHER): Payer: Managed Care, Other (non HMO)

## 2022-09-17 DIAGNOSIS — Z1231 Encounter for screening mammogram for malignant neoplasm of breast: Secondary | ICD-10-CM | POA: Diagnosis not present

## 2022-09-24 ENCOUNTER — Telehealth: Payer: Self-pay | Admitting: *Deleted

## 2022-09-24 NOTE — Telephone Encounter (Signed)
Left patient a message to call to schedule annual for the year.

## 2023-08-25 NOTE — Progress Notes (Signed)
 Patient ID:  Kimberly [name] is a 48 y.o.  October 14, 1975   female     Cough (Cough started Friday. Taking IBU/Alieve. No N/V/D. No recent travel. ), Chest Congestion, Shortness of Breath (SHOB started Sunday. Did use inhaler Sunday but states it did not help.), and Dizziness   New problem to me History given by patient:  Patient has been having cough, congestion, wheezing, sinus pressure and drainage for about 5 days. Patient denies chest pain but is SOB due to wheezing. Accompanied by:         self Duration: 5 days COVID Vaccinated  yes  Known COVID-19 exposure: no   Symptoms:  (below are positive unless otherwised marked) No increase in Allergies  over the past 2 weeks or worsening of year round allergies  Headache  Nasal congestion  w    facial pain and pressure   Ear pain/pressure  ST described as mild to moderate           tickle/dry scratchy sensation   Cough    + productive  fever (ish)  chills sweats  Fatigue  Body aches No shob with exertion No rash No loss of taste No loss of smell No nausea  No vomiting  No diarrhea    No history of asthma No history of COPD No history of tobacco use Timing:  progressively worsening  Alleviating:  Has tried OTC meds with minimal symptoms relief Aggravating:  being up and mobile     Review of Systems: All others reviewed and negative except as listed above.   Past Medical History, Past surgery History, Allergies, Social history, and Family History were reviewed and updated.  During this patient encounter, the patient was wearing a mask.  Throughout this encounter, I was wearing at least a surgical mask.  I was not within 6 feet of this patient for more than 15 minutes without eye protection when they were not wearing mask.   EXAM:  BP (!) 134/109   Pulse 100   Temp 99.5 F (37.5 C)   Resp 18   SpO2 96%    Constitutional:  WN, WD, NAD  Neuro:  A&O x 3   Head:  Atraumatic, normocephalic  Eyes:  PERRL, conjunctiva  noninjected  Nares:  Mucosa erythematous with clear rhinorrhea   mild  ttp over max  / frontal sinuses    TM:   dull   /  bulging   OP:  Cobblestone, erythematous  Neck: No lymphadenopathy  CV:  RRR without MRG  Resp:  Nonlabored, good airflow, no use of accessory muscles, no tripoding, mild  wheezing, without rales or rhonchi.  Deep inspiration causes cough  Abd: soft NTND + BS -HSM, No guarding, no rebound tenderness, No CVA   Skin:  No lesions, focal rashes, or ulcerations.  Recent Results (from the past 24 hours)  POC SARS-COV-2 SYMPTOMATIC (IDNOW)   Collection Time: 08/25/23 12:41 PM  Result Value Ref Range   SARS-COV-2 IDNOW Negative Negative   SARS IDNOW Information      A rapid, molecular diagnostic test on the IDNOW.  Negative results should be treated as presumptive and, if inconsistent with clinical symptoms should be confirmed with an alternative molecular assay.  POC Influenza A&B NAT (IDNOW)   Collection Time: 08/25/23 12:42 PM  Result Value Ref Range   Influenza A RNA Negative Negative   Influenza B RNA Negative Negative     I have personally review radiographs and my interpretation of the radiograph  is:  defer to radiology read  Radiologist interpretation:  XR Chest 2 Views  Final Result by Rankin Ina Slice, MD (05/13 1310)  XR CHEST 2 VIEWS, 08/25/2023 12:29 PM    INDICATION: cough, congestion, fevers, wheezing for about 5 days. rule out   pneumonia, Acute upper respiratory infection, unspecified \ J06.9 Acute   upper respiratory infection, unspecified \ R50.9 Fever, unspecified   cough, congestion, fevers, wheezing for about 5 days. rule out pneumonia  COMPARISON: None    FINDINGS:     Cardiovascular: Cardiac silhouette and pulmonary vasculature are within   normal limits.  Mediastinum: Within normal limits.  Lungs/pleura: Mild central peribronchial thickening. No pulmonary   consolidation. No pleural effusion or pneumothorax.  Upper abdomen:  Visualized portions are unremarkable.   Chest wall/osseous structures: No acute fracture. Polyarticular   degenerative changes.      IMPRESSION:  Nonspecific central peribronchial thickening, although concerning for   viral respiratory illness, or bronchitis-bronchiolitis. No pulmonary   consolidation.          Lab work and x-rays were reviewed and incorporated into the decision making process.    Impression/Plan 1. Acute bacterial bronchitis (Primary) - POC SARS-COV-2 SYMPTOMATIC (IDNOW) - POC Influenza A&B NAT (IDNOW) - XR Chest 2 Views - ipratropium-albuteroL (DUO-NEB) 0.5-2.5 mg/3 mL nebulizer solution 3 mL - amoxicillin-pot clavulanate (AUGMENTIN) 875-125 mg per tablet; Take one tablet by mouth 2 (two) times a day for 10 days.  Dispense: 20 tablet; Refill: 0 - methylPREDNISolone (MEDROL DOSEPAK) 4 mg 6 day dose pack; Take As Directed On Package  Dispense: 21 tablet; Refill: 0 - benzonatate (TESSALON) 100 mg capsule; Take one capsule (100 mg total) by mouth 3 (three) times a day as needed for cough.  Dispense: 30 capsule; Refill: 0  2. Fever, unspecified fever cause - POC SARS-COV-2 SYMPTOMATIC (IDNOW) - POC Influenza A&B NAT (IDNOW) - XR Chest 2 Views - ipratropium-albuteroL (DUO-NEB) 0.5-2.5 mg/3 mL nebulizer solution 3 mL  3. Wheezing - methylPREDNISolone (MEDROL DOSEPAK) 4 mg 6 day dose pack; Take As Directed On Package  Dispense: 21 tablet; Refill: 0      I am treating patient with augmentin, cough suppressant and steroids for its anti-inflammatory benefits.  Patient also has co morbid conditions that are concerning and I want to give some prophylactic benefit and coverage for a secondary bacterial infection/bronchitis   (Bronchitis from virus)I think the patient  has developed a secondary bronchitis/post viral cough secondary to the Viral illness that patient  had 1-2 weeks ago.     Patient has been instructed on RX/ OTC medications, dosages, side effects, and  possible interactions as associated with each diagnosis in my impression and plan above.  2.   Patient education  (verbal/handout) given on diagnosis, pathophysiology, treatment of diagnosis, side effects of medication use for treatment, restrictions while taking medication, supportive       measures such  as  supportive care, pushing fluids, BRAT diet, fluid rehydration protocol if needed.       Red Flags associated with diagnosis/es were reviewed and patient instructed on action plan if red flags develop.  3.  Patient was instructed on follow up care:        Discharged to Home Care with Follow up as previously directed by PCP             They have been instructed that if symptoms worse that should go to Urgent Care, go to the nearest ED, or activate EMS.  4.  Patient agreed with plan and voiced understanding.  NO barriers to adherence perceived by myself.  Urgent Care Disposition:  Follow up with PCP

## 2023-09-02 ENCOUNTER — Telehealth: Payer: Self-pay | Admitting: *Deleted

## 2023-09-02 NOTE — Telephone Encounter (Signed)
 Returned call. Left patient a message to call back to schedule annual or the office will call her back on 09/03/2023.

## 2023-09-09 NOTE — Progress Notes (Signed)
 Patient ID:  Kimberly [name] is a 48 y.o. (DOB 06/03/1975) female.   Assessment and Plan  1. Essential hypertension (Primary) -     Comp. Metabolic Panel (14); Future 2. Gastro-esophageal reflux disease without esophagitis 3. Anxiety 4. Recurrent major depressive disorder, in partial remission 5. Insomnia, unspecified type 6. Prediabetes -     Hemoglobin A1c; Future 7. Morbid obesity (*) -     Lipid Panel; Future 8. Encounter for screening mammogram for malignant neoplasm of breast -     Mammo 3D Tomo Screening Bilateral; Future Other orders -     busPIRone  (BUSPAR ) 15 MG tablet; Take one tablet (15 mg dose) by mouth 2 (two) times daily., Starting Wed 09/09/2023, Until Thu 09/08/2024, Normal -     DULoxetine HCl (CYMBALTA) 60 mg capsule; Take one capsule (60 mg dose) by mouth 2 (two) times daily., Starting Wed 09/09/2023, Normal    Assessment & Plan 1. Hypertension. - Currently taking valsartan and hydrochlorothiazide but does not check blood pressure at home. - No symptoms such as chest pain, headache, blurry vision, double vision, shortness of breath, or palpitations reported. - Blood pressure will be rechecked before departure. - Refills for medications sent to pharmacy.  2. Recent bronchitis. - Recently completed a course of steroids, amoxicillin, and an inhaler for bronchitis. - Persistent cough noted, which may last for several weeks. - No shortness of breath currently.  3. Insomnia. - Not taking trazodone for sleep and struggling with insomnia. - No current treatment for insomnia discussed. - Will follow up if needed.  4. Gastroesophageal reflux disease. - Using Tums occasionally for reflux symptoms. - Symptoms possibly exacerbated by recent bronchitis medication. - No regular medication for reflux.  5. Weight management. - Concerns about rapid weight loss and its effects on skin. - Discussed weight loss options including Noom, phentermine, and CoreLife. - Will  consider options and follow up after OB/GYN appointment next week.  6. Perimenopause. - Experiencing perimenopausal symptoms likely contributing to weight loss difficulty. - Will discuss potential treatments with OB/GYN next week. - No current treatment for perimenopause.  7. Prediabetes. - Last A1c indicated prediabetes. - A1c test will be conducted today to monitor condition. - Will recheck kidney function with blood work today.  8. Health maintenance. - Pap smear is scheduled. - Mammogram will be ordered to be scheduled on or after 09/17/2023. - Blood work will be done today to recheck kidney function and A1c levels. - Will schedule annual wellness visit before leaving today.   Computer technology was used to create visit note. Consent from the patient/caregiver was obtained prior to its use.   Follow up in about 3 months (around 12/10/2023) for Annual Physical, without pap.   Patient's Medications  New Prescriptions   No medications on file  Previous Medications   HYDROCHLOROTHIAZIDE 12.5 MG TABLET    Take one tablet (12.5 mg dose) by mouth daily.   TRAZODONE (DESYREL) 50 MG TABLET    Take one tablet to three tablets (50-150 mg dose) by mouth at bedtime.   VALSARTAN (DIOVAN) 320 MG TABLET    Take one tablet (320 mg dose) by mouth daily.  Modified Medications   Modified Medication Previous Medication   BUSPIRONE  (BUSPAR ) 15 MG TABLET busPIRone  (BUSPAR ) 15 MG tablet      Take one tablet (15 mg dose) by mouth 2 (two) times daily.    Take one tablet (15 mg dose) by mouth 2 (two) times daily.   DULOXETINE HCL (CYMBALTA)  60 MG CAPSULE DULoxetine HCl (CYMBALTA) 60 mg capsule      Take one capsule (60 mg dose) by mouth 2 (two) times daily.    Take one capsule (60 mg dose) by mouth 2 (two) times daily.  Discontinued Medications   METHYLPREDNISOLONE (MEDROL DOSEPACK) 4 MG TABLET    Take one tablet (4 mg dose) by mouth daily. follow package directions     Subjective   Patient ID:   Kimberly [name] is a 48 y.o. (DOB Mar 24, 1976) female    Patient presents with  . Follow-up     History of Present Illness The patient is a 48 year old female who presents today for a chronic condition follow-up.  She is currently on valsartan and hydrochlorothiazide for blood pressure management but does not monitor her blood pressure at home. She reports no chest pain, headaches, blurry or double vision, shortness of breath, or palpitations.  She recently completed a course of medication for bronchitis, with the last dose taken on 09/01/2023. Her treatment regimen included a steroid, amoxicillin, and an inhaler. She underwent chest x-rays and received a breathing treatment at a walk-in clinic. She continues to experience a cough, although she does not believe it is related to bronchitis.  She has been experiencing sleep disturbances and has not been taking trazodone. She has been managing her reflux symptoms with Tums, which she has used twice in the past week. She suspects that her reflux may be a side effect of the bronchitis medication.  She is interested in discussing weight loss medications but expresses concern about potential rapid weight loss. She is aware of her age and the amount of weight she has gained and is concerned about skin elasticity. She is hesitant to try intermittent fasting due to persistent hunger. She is currently working from home and plans to set reminders to encourage physical activity. She has previously found success with Weight Watchers but has not tried it since the birth of her child. She believes her metabolism has slowed significantly since turning 41. She is currently in perimenopause and plans to discuss potential energy-boosting strategies with her OB/GYN next week. She reports a lack of motivation and plans to follow up after her upcoming OB/GYN appointment.  She is still taking duloxetine and BuSpar .  She has a Pap smear scheduled and has not yet undergone a  colonoscopy. Her last mammogram was conducted in 09/2022.     Reviewed and updated this visit by provider: Tobacco  Allergies  Meds  Problems  Med Hx  Surg Hx  Fam Hx        Review of Systems  Constitutional:  Negative for activity change, appetite change, chills, diaphoresis, fatigue, fever and unexpected weight change.  HENT:  Negative for congestion, ear pain, postnasal drip, rhinorrhea, sinus pressure, sinus pain, sneezing, sore throat and tinnitus.   Eyes:  Negative for photophobia, pain, redness and visual disturbance.  Respiratory:  Negative for cough, chest tightness, shortness of breath and wheezing.   Cardiovascular:  Negative for chest pain, palpitations and leg swelling.  Gastrointestinal:  Negative for abdominal pain, constipation, diarrhea, nausea and vomiting.  Endocrine: Negative for cold intolerance, heat intolerance, polydipsia, polyphagia and polyuria.  Genitourinary:  Negative for difficulty urinating, dysuria, flank pain, hematuria and urgency.  Musculoskeletal:  Negative for gait problem, neck pain and neck stiffness.  Skin:  Negative for rash.  Allergic/Immunologic: Negative for environmental allergies, food allergies and immunocompromised state.  Neurological:  Negative for dizziness, weakness, light-headedness and headaches.  Hematological:  Negative for adenopathy. Does not bruise/bleed easily.  Psychiatric/Behavioral:  Negative for dysphoric mood and sleep disturbance. The patient is not nervous/anxious.      Objective  BP 142/82 (BP Location: Right Upper Arm, Patient Position: Sitting)   Pulse 110   Temp 97.8 F (36.6 C) (Temporal)   Ht 5' 9 (1.753 m)   Wt 272 lb 9.6 oz (123.7 kg)   LMP 08/30/2023 (Approximate)   SpO2 98%   BMI 40.26 kg/m    Physical Exam Vitals reviewed.  Constitutional:      General: She is not in acute distress.    Appearance: Normal appearance. She is well-developed. She is not ill-appearing.  HENT:     Head:  Normocephalic and atraumatic.  Eyes:     Conjunctiva/sclera: Conjunctivae normal.     Pupils: Pupils are equal, round, and reactive to light.  Cardiovascular:     Rate and Rhythm: Normal rate and regular rhythm.     Pulses: Normal pulses.     Heart sounds: Normal heart sounds. No murmur heard.    No friction rub. No gallop.  Musculoskeletal:     Cervical back: Normal range of motion and neck supple.  Pulmonary:     Effort: Pulmonary effort is normal. No respiratory distress.     Breath sounds: Normal breath sounds. No wheezing.  Lymphadenopathy:     Cervical: No cervical adenopathy.  Skin:    General: Skin is warm and dry.     Capillary Refill: Capillary refill takes less than 2 seconds.  Neurological:     Mental Status: She is alert and oriented to person, place, and time.  Psychiatric:        Mood and Affect: Mood normal.        Behavior: Behavior normal.        Thought Content: Thought content normal.        Judgment: Judgment normal.       Risks, benefits, and alternatives of the medications and treatment plan prescribed today were discussed, and patient expressed understanding. Plan follow-up as discussed or as needed if any worsening symptoms or change in condition.

## 2023-09-10 NOTE — Progress Notes (Signed)
 Your recent results and recommendations are as follows: -Cholesterol is overall normal with elevation in your triglycerides likely due to higher fat diet. -A1c continues to indicate prediabetes. Exercise for 30 minutes 3-4 days per week and watch diet limiting white carbs (white rice, bread, pastas) and concentrated sweets (juices, cakes, cookies, candies, ice cream, sweet tea) to keep from increasing blood glucose to diabetes ranges.   -Electrolytes, Kidney function & Liver function are normal with some slight variation as is expected.    If you have further questions, feel free to send a MyChart message or reach out to our office at [phone].  Best regards, Daved Sensor, FNP-C

## 2023-09-16 ENCOUNTER — Ambulatory Visit: Admitting: Obstetrics and Gynecology

## 2023-09-23 ENCOUNTER — Other Ambulatory Visit (HOSPITAL_COMMUNITY)
Admission: RE | Admit: 2023-09-23 | Discharge: 2023-09-23 | Disposition: A | Discharge status: Home / Self Care | Source: Ambulatory Visit | Attending: Obstetrics and Gynecology | Admitting: Obstetrics and Gynecology

## 2023-09-23 ENCOUNTER — Encounter: Payer: Self-pay | Admitting: Obstetrics and Gynecology

## 2023-09-23 ENCOUNTER — Ambulatory Visit (INDEPENDENT_AMBULATORY_CARE_PROVIDER_SITE_OTHER): Admitting: Obstetrics and Gynecology

## 2023-09-23 VITALS — BP 127/90 | HR 92 | Wt 272.0 lb

## 2023-09-23 DIAGNOSIS — R232 Flushing: Secondary | ICD-10-CM | POA: Diagnosis not present

## 2023-09-23 DIAGNOSIS — Z01419 Encounter for gynecological examination (general) (routine) without abnormal findings: Secondary | ICD-10-CM | POA: Diagnosis not present

## 2023-09-23 DIAGNOSIS — I10 Essential (primary) hypertension: Secondary | ICD-10-CM | POA: Diagnosis not present

## 2023-09-23 NOTE — Progress Notes (Signed)
 ANNUAL EXAM Patient name: Kimberly [name] MRN [id number]  Date of birth: [birth year] Chief Complaint:   Gynecologic Exam  History of Present Illness:   Kimberly [name] is a 48 y.o. G19P1001 female being seen today for a routine annual exam.   Current concerns: Doesn't feel like its a good fit with her new PCP - Dr. Thad Files through [facility]. She would still like to have a work up for the changes she has had in her health I.e. rule out secondary causes of hypertension.   She has some brain fog and hot flashes. She has some worsening mood prior to menses. She has regular menses still. She does not want to do anything hormonal at this time until she feels like the rest of her health is more in order.   She is married and sexually active with her spouse - he had a vasectomy. She [facility] and has her daughter at home.   Patient's last menstrual period was 08/29/2023 (approximate).   Last MXR: 09/18/22 - birad 1 Last Pap/Pap History:  2011: normal 2015-17: normal/HPV neg 09/12/2019: normal/HPV pos  Review of Systems:   Pertinent items are noted in HPI Denies any headaches, blurred vision, fatigue, shortness of breath, chest pain, abdominal pain, abnormal vaginal discharge/itching/odor/irritation, problems with periods, bowel movements, urination, or intercourse unless otherwise stated above.  Pertinent History Reviewed:  Reviewed past medical,surgical, social and family history.  Reviewed problem list, medications and allergies. Physical Assessment:   Vitals:   09/23/23 1601 09/23/23 1606  BP: (!) 148/83 (!) 127/90  Pulse: 90 92  Weight: 272 lb (123.4 kg)   Body mass index is 40.17 kg/m.   Physical Examination:  General appearance - well appearing, and in no distress Mental status - alert, oriented to person, place, and time Psych:  She has a normal mood and affect Skin - warm and dry, normal color, no suspicious lesions noted Chest - effort normal Heart - normal rate  Breasts - breasts appear  normal, no suspicious masses, no skin or nipple changes or axillary nodes Abdomen - soft, nontender, nondistended, no masses or organomegaly Pelvic -  Performed and: VULVA: normal appearing vulva with no masses, tenderness or lesions  VAGINA: normal appearing vagina with normal color and discharge, no lesions  CERVIX: normal appearing cervix without discharge or lesions, no CMT UTERUS: uterus is felt to be normal size, shape, consistency and nontender. ADNEXA: No adnexal masses or tenderness noted. Extremities:  No swelling or varicosities noted  Chaperone present for exam  No results found for this or any previous visit (from the past 24 hours).  Assessment & Plan:  Kimberly [name] was seen today for gynecologic exam.  Diagnoses and all orders for this visit:  Encounter for annual routine gynecological examination - Cervical cancer screening: Discussed guidelines. Pap with HPV done - Breast Health: Encouraged self breast awareness/SBE. Discussed limits of clinical breast exam for detecting breast cancer. Discussed importance of annual MXR. Rx given for MXR - Climacteric/Sexual health: Reviewed typical and atypical symptoms of menopause/peri-menopause. Discussed PMB and to call if any amount of spotting.  - Colonoscopy: Per PCP - F/U 12 months and prn -     Cytology - PAP  Hot flashes No intervention at this time.   Hypertension, unspecified type Will refer to PCP down the hall   No orders of the defined types were placed in this encounter.   Meds: No orders of the defined types were placed in this encounter.   Follow-up: No  follow-ups on file.  Lacey Pian, MD 09/23/2023 4:53 PM

## 2023-09-27 ENCOUNTER — Ambulatory Visit: Payer: Self-pay | Admitting: Obstetrics and Gynecology

## 2024-01-07 ENCOUNTER — Other Ambulatory Visit: Payer: Self-pay

## 2024-01-07 ENCOUNTER — Encounter: Payer: Self-pay | Admitting: Obstetrics and Gynecology

## 2024-01-07 DIAGNOSIS — F419 Anxiety disorder, unspecified: Secondary | ICD-10-CM

## 2024-01-07 MED ORDER — BUSPIRONE HCL 15 MG PO TABS: 15.0000 mg | ORAL_TABLET | Freq: Two times a day (BID) | ORAL | 1 refills | Status: DC

## 2024-02-08 ENCOUNTER — Other Ambulatory Visit: Payer: Self-pay

## 2024-02-08 DIAGNOSIS — F419 Anxiety disorder, unspecified: Secondary | ICD-10-CM

## 2024-02-08 DIAGNOSIS — I10 Essential (primary) hypertension: Secondary | ICD-10-CM

## 2024-02-08 MED ORDER — HYDROCHLOROTHIAZIDE 12.5 MG PO TABS: 12.5000 mg | ORAL_TABLET | Freq: Every day | ORAL | 0 refills | Status: DC

## 2024-02-08 MED ORDER — VALSARTAN 320 MG PO TABS: 320.0000 mg | ORAL_TABLET | Freq: Every day | ORAL | 0 refills | Status: DC

## 2024-02-08 MED ORDER — BUSPIRONE HCL 15 MG PO TABS: 15.0000 mg | ORAL_TABLET | Freq: Two times a day (BID) | ORAL | 0 refills | Status: AC

## 2024-02-08 MED ORDER — DULOXETINE HCL 60 MG PO CPEP: 60.0000 mg | ORAL_CAPSULE | Freq: Two times a day (BID) | ORAL | 0 refills | Status: AC

## 2024-03-23 ENCOUNTER — Ambulatory Visit: Admitting: Urgent Care

## 2024-03-23 DIAGNOSIS — N951 Menopausal and female climacteric states: Secondary | ICD-10-CM

## 2024-03-23 DIAGNOSIS — F418 Other specified anxiety disorders: Secondary | ICD-10-CM | POA: Diagnosis not present

## 2024-03-23 DIAGNOSIS — Z6841 Body Mass Index (BMI) 40.0 and over, adult: Secondary | ICD-10-CM

## 2024-03-23 MED ORDER — LISDEXAMFETAMINE DIMESYLATE 10 MG PO CAPS: 10.0000 mg | ORAL_CAPSULE | Freq: Every day | ORAL | 0 refills | Status: DC

## 2024-03-23 NOTE — Patient Instructions (Addendum)
 I would strongly encourage you to perform several of the following: - look into purchasing a SAD light (also known as happy light, or light box therapy). It in essence is a light that mimics the effects of sunshine, thus increasing serotonin levels. You can purchase these online, at [facility] or [employer] for around $40. - over the counter L-tryptophan. Taking 500mg  three times daily, or 1000mg  prior to bed. This medication can make you feel sleepy - Get involved in mom groups. Socialization and face to face interaction with others usually helps improve depression and feelings of isolation. Look into [facility] on main - additional over the counter stress relief medications --> Boiron - Stress calm are natural herbal tablets - essential oils, in particular lavender, can be helpful - practicing deep breathing. This helps more with stress and anxiety - caffeine and sugar are linked to depression. Drinking more water, increasing physical activity and eating a more anti-inflammatory diet (such as natural fruits and veggies) can help.   Start vyvanse - this can help with energy and weight. Monitor your Blood pressure.  Return in 3 weeks  Cognitive behavioral therapy - consider purchasing books about this online. Consider referral to therapy locally. Look online for the [facility] home workout programs.

## 2024-03-23 NOTE — Progress Notes (Signed)
 New Patient Office Visit  Subjective:  Patient ID: Kimberly [name], female    DOB: March 07, 1976  Age: 48 y.o. MRN: [id number]  CC:  Chief Complaint  Patient presents with   Establish Care    Weight concerns; anxiety/depression    HPI Kimberly [name] presents to establish care.  Discussed the use of AI scribe software for clinical note transcription with the patient, who gave verbal consent to proceed.  History of Present Illness   Kimberly [name] is a 48 year old female with anxiety, depression, and hypertension who presents with concerns about weight gain and medication management.  She has experienced significant weight gain since the COVID-19 pandemic, attributed to working from home and reduced physical activity. Historically, her weight ranged between 175 to 200 pounds, but she managed it well until recently. She wants to exercise more but finds it challenging due to her responsibilities as a mother and her work schedule.  Her anxiety and depression have worsened over the past month, possibly due to holiday stress. She describes a recent episode of feeling overwhelmed, requiring half a Xanax to manage her symptoms. Her anxiety and depression were first diagnosed in 2009 during a divorce and were initially treated with Cymbalta . She resumed Cymbalta  postpartum after her daughter's birth seven years ago. Currently, she is on duloxetine  60 mg and buspirone  15 mg for anxiety and depression.  She has a history of hypertension, first noted during her pregnancy seven years ago. She is currently on hydrochlorothiazide  12.5 mg and valsartan , with a previous prescription for amlodipine  that she believes has been discontinued. She was induced at 38 weeks during her pregnancy due to elevated blood pressure.  She notes irregular menstrual cycles recently, with spotting and cramping, which she attributes to stress. She is not pregnant and typically has regular cycles.  Her current medications include  duloxetine  60 mg, buspirone  15 mg, hydrochlorothiazide  12.5 mg, and valsartan . She also takes a multivitamin and occasionally uses alprazolam 0.5 mg for acute anxiety episodes. She has a history of using albuterol for a chest cold last year but does not use it regularly.  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 Her husband is a it sales professional, which impacts her daily routine as he is away for 24-hour shifts every other day.  She feels tired despite adequate sleep, increased stress, and anxiety. No recent heart palpitations or racing heart.       Outpatient Encounter Medications as of 03/23/2024  Medication Sig   albuterol (VENTOLIN HFA) 108 (90 Base) MCG/ACT inhaler Inhale 2 puffs into the lungs.   ALPRAZolam (XANAX) 0.5 MG tablet 0.5-1 tablet by mouth Twice a day, as needed for anxiety   busPIRone  (BUSPAR ) 15 MG tablet Take 1 tablet (15 mg total) by mouth 2 (two) times daily.   DULoxetine  (CYMBALTA ) 60 MG capsule Take 1 capsule (60 mg total) by mouth 2 (two) times daily.   hydrochlorothiazide  (HYDRODIURIL ) 12.5 MG tablet Take 1 tablet (12.5 mg total) by mouth daily.   lisdexamfetamine (VYVANSE) 10 MG capsule Take 1 capsule (10 mg total) by mouth daily before breakfast.   [START ON 04/22/2024] lisdexamfetamine (VYVANSE) 10 MG capsule Take 1 capsule (10 mg total) by mouth daily before breakfast.   [START ON 05/22/2024] lisdexamfetamine (VYVANSE) 10 MG capsule Take 1 capsule (10 mg total) by mouth daily before breakfast.   Multiple Vitamin (MULTIVITAMIN WITH MINERALS) TABS tablet Take 1 tablet by mouth daily.   valsartan  (  DIOVAN ) 320 MG tablet Take 1 tablet (320 mg total) by mouth daily.   [DISCONTINUED] amLODipine  (NORVASC ) 5 MG tablet Take 1 tablet (5 mg total) by mouth daily.   [DISCONTINUED] buPROPion (WELLBUTRIN XL) 150 MG 24 hr tablet 1 tablet by mouth every morning for 30 days   No facility-administered encounter medications on file as of 03/23/2024.     Past Medical History:  Diagnosis Date   Anxiety    Depression    Disc degeneration, lumbar    Gallstones    Pregnancy induced hypertension     Past Surgical History:  Procedure Laterality Date   CHOLECYSTECTOMY      Family History  Problem Relation Age of Onset   Heart disease Mother    Hypertension Father    Heart disease Father    Depression Father    Hypertension Maternal Grandmother     Social History   Socioeconomic History   Marital status: Married    Spouse name: Not on file   Number of children: 1   Years of education: Not on file   Highest education level: Bachelor's degree (e.g., BA, AB, BS)  Occupational History   Not on file  Tobacco Use   Smoking status: Never   Smokeless tobacco: Never  Vaping Use   Vaping status: Never Used  Substance and Sexual Activity   Alcohol use: Not Currently    Alcohol/week: 1.0 standard drink of alcohol    Types: 1 Standard drinks or equivalent per week   Drug use: Never   Sexual activity: Yes    Partners: Male    Birth control/protection: None    Comment: Husband had Vasectomy  Other Topics Concern   Not on file  Social History Narrative   Not on file   Social Drivers of Health   Financial Resource Strain: Low Risk  (03/23/2024)   Overall Financial Resource Strain (CARDIA)    Difficulty of Paying Living Expenses: Not hard at all  Food Insecurity: No Food Insecurity (03/23/2024)   Hunger Vital Sign    Worried About Running Out of Food in the Last Year: Never true    Ran Out of Food in the Last Year: Never true  Transportation Needs: No Transportation Needs (03/23/2024)   PRAPARE - Administrator, Civil Service (Medical): No    Lack of Transportation (Non-Medical): No  Physical Activity: Inactive (03/23/2024)   Exercise Vital Sign    Days of Exercise per Week: 0 days    Minutes of Exercise per Session: Not on file  Stress: Stress Concern Present (03/23/2024)   [employer] of  Occupational Health - Occupational Stress Questionnaire    Feeling of Stress: To some extent  Social Connections: Moderately Isolated (03/23/2024)   Social Connection and Isolation Panel    Frequency of Communication with Friends and Family: Twice a week    Frequency of Social Gatherings with Friends and Family: Once a week    Attends Religious Services: Never    Database Administrator or Organizations: No    Attends Engineer, Structural: Not on file    Marital Status: Married  Catering Manager Violence: Not At Risk (09/09/2023)   Received from [facility]   HITS    Over the last 12 months how often did your partner physically hurt you?: Never    Over the last 12 months how often did your partner insult you or talk down to you?: Never    Over the last 12 months  how often did your partner threaten you with physical harm?: Never    Over the last 12 months how often did your partner scream or curse at you?: Never    ROS: as noted in HPI  Objective:  BP 128/73   Pulse 83   Ht 5' 9 (1.753 m)   Wt 274 lb (124.3 kg)   SpO2 98%   BMI 40.46 kg/m   Physical Exam Vitals and nursing note reviewed. Exam conducted with a chaperone present.  Constitutional:      General: She is not in acute distress.    Appearance: Normal appearance. She is not ill-appearing, toxic-appearing or diaphoretic.  HENT:     Head: Normocephalic and atraumatic.  Eyes:     General: No scleral icterus.       Right eye: No discharge.        Left eye: No discharge.     Extraocular Movements: Extraocular movements intact.     Pupils: Pupils are equal, round, and reactive to light.  Cardiovascular:     Rate and Rhythm: Normal rate and regular rhythm.  Pulmonary:     Effort: Pulmonary effort is normal. No respiratory distress.     Breath sounds: Normal breath sounds. No stridor. No wheezing.  Musculoskeletal:     Cervical back: Normal range of motion and neck supple. No rigidity or tenderness.   Lymphadenopathy:     Cervical: No cervical adenopathy.  Skin:    General: Skin is warm and dry.     Coloration: Skin is not jaundiced.     Findings: No bruising, erythema or rash.  Neurological:     General: No focal deficit present.     Mental Status: She is alert and oriented to person, place, and time.     Gait: Gait normal.  Psychiatric:        Mood and Affect: Mood normal.        Behavior: Behavior normal.       03/23/2024    9:57 AM 09/23/2023    4:45 PM  Depression screen PHQ 2/9  Decreased Interest 1 0  Down, Depressed, Hopeless 1 0  PHQ - 2 Score 2 0  Altered sleeping 1 1  Tired, decreased energy 2 1  Change in appetite 1 1  Feeling bad or failure about yourself  1 0  Trouble concentrating 2 1  Moving slowly or fidgety/restless 1 0  Suicidal thoughts 0 0  PHQ-9 Score 10 4   Difficult doing work/chores Very difficult Not difficult at all     Data saved with a previous flowsheet row definition      03/23/2024    9:57 AM 09/23/2023    4:45 PM  GAD 7 : Generalized Anxiety Score  Nervous, Anxious, on Edge 2 0  Control/stop worrying 2 1  Worry too much - different things 2 1  Trouble relaxing 2 0  Restless 1 0  Easily annoyed or irritable 3 0  Afraid - awful might happen 0 0  Total GAD 7 Score 12 2  Anxiety Difficulty Very difficult Not difficult at all     Last CBC Lab Results  Component Value Date   WBC 9.4 08/25/2019   HGB 12.9 08/25/2019   HCT 37.2 08/25/2019   MCV 95.1 08/25/2019   MCH 33.0 08/25/2019   RDW 12.0 08/25/2019   PLT 326 08/25/2019   Last metabolic panel Lab Results  Component Value Date   GLUCOSE 104 (H) 12/16/2016   NA 135  12/16/2016   K 4.1 12/16/2016   CL 104 12/16/2016   CO2 22 12/16/2016   BUN 12 12/16/2016   CREATININE 0.59 12/16/2016   GFRNONAA >60 12/16/2016   CALCIUM 8.9 12/16/2016   PROT 7.0 12/16/2016   ALBUMIN 3.2 (L) 12/16/2016   BILITOT 0.3 12/16/2016   ALKPHOS 89 12/16/2016   AST 22 12/16/2016   ALT 15  12/16/2016   ANIONGAP 9 12/16/2016   Last lipids Lab Results  Component Value Date   CHOL 148 08/25/2019   HDL 40 (L) 08/25/2019   LDLCALC 74 08/25/2019   TRIG 258 (H) 08/25/2019   CHOLHDL 3.7 08/25/2019   Last hemoglobin A1c Lab Results  Component Value Date   HGBA1C 5.4 08/25/2019   Last thyroid functions Lab Results  Component Value Date   TSH 1.64 08/25/2019   Last vitamin D No results found for: 25OHVITD2, 25OHVITD3, VD25OH Last vitamin B12 and Folate No results found for: VITAMINB12, FOLATE    Assessment & Plan:  Morbid obesity (HCC) -     Lisdexamfetamine Dimesylate; Take 1 capsule (10 mg total) by mouth daily before breakfast.  Dispense: 30 capsule; Refill: 0 -     Lisdexamfetamine Dimesylate; Take 1 capsule (10 mg total) by mouth daily before breakfast.  Dispense: 30 capsule; Refill: 0 -     Lisdexamfetamine Dimesylate; Take 1 capsule (10 mg total) by mouth daily before breakfast.  Dispense: 30 capsule; Refill: 0  BMI 40.0-44.9, adult (HCC) -     Lisdexamfetamine Dimesylate; Take 1 capsule (10 mg total) by mouth daily before breakfast.  Dispense: 30 capsule; Refill: 0 -     Lisdexamfetamine Dimesylate; Take 1 capsule (10 mg total) by mouth daily before breakfast.  Dispense: 30 capsule; Refill: 0 -     Lisdexamfetamine Dimesylate; Take 1 capsule (10 mg total) by mouth daily before breakfast.  Dispense: 30 capsule; Refill: 0  Mixed anxiety depressive disorder -     Lisdexamfetamine Dimesylate; Take 1 capsule (10 mg total) by mouth daily before breakfast.  Dispense: 30 capsule; Refill: 0 -     Lisdexamfetamine Dimesylate; Take 1 capsule (10 mg total) by mouth daily before breakfast.  Dispense: 30 capsule; Refill: 0 -     Lisdexamfetamine Dimesylate; Take 1 capsule (10 mg total) by mouth daily before breakfast.  Dispense: 30 capsule; Refill: 0  Perimenopausal  Assessment and Plan    Morbid obesity Weight gain due to lifestyle changes post-COVID.  Discussed Vyvanse for short-term weight loss, benefits on metabolism, energy, focus, and lower blood pressure impact compared to phentermine. Reviewed side effects and caffeine monitoring. - Prescribed Vyvanse for short-term weight loss. - Encouraged physical activity, e.g., walking or online workouts. - Discussed light therapy box for seasonal affective disorder.  Mixed anxiety and depressive disorder Increased anxiety and depression, possibly due to holiday stress. Current medications: duloxetine  and buspirone . Emphasized lifestyle modifications and CBT as adjuncts. - Continue duloxetine  and buspirone . - Encouraged CBT and self-help books on CBT. - Discussed self-care and stress management techniques.  Hypertension On hydrochlorothiazide  and valsartan . Discussed Vyvanse's potential impact on blood pressure. - Continue hydrochlorothiazide  and valsartan . - Monitor blood pressure regularly, especially with Vyvanse.  Perimenopause Confirmed by positive test results. Symptoms include irregular cycles and cramping, possibly stress-related. - Monitor menstrual cycle and symptoms.      Total time spent including face to face time, chart review, lab interpretation, documentation and extensive counseling on lifestyle modifications was 45 minutes   Return in about 4 weeks (around 04/20/2024).  Benton LITTIE Gave, PA

## 2024-03-23 NOTE — Progress Notes (Signed)
 Kimberly [name]

## 2024-04-20 ENCOUNTER — Ambulatory Visit: Admitting: Urgent Care

## 2024-04-20 VITALS — BP 127/82 | HR 68 | Ht 69.0 in | Wt 271.8 lb

## 2024-04-20 DIAGNOSIS — R41840 Attention and concentration deficit: Secondary | ICD-10-CM | POA: Diagnosis not present

## 2024-04-20 DIAGNOSIS — Z6841 Body Mass Index (BMI) 40.0 and over, adult: Secondary | ICD-10-CM

## 2024-04-20 DIAGNOSIS — F418 Other specified anxiety disorders: Secondary | ICD-10-CM

## 2024-04-20 DIAGNOSIS — N951 Menopausal and female climacteric states: Secondary | ICD-10-CM | POA: Diagnosis not present

## 2024-04-20 DIAGNOSIS — I10 Essential (primary) hypertension: Secondary | ICD-10-CM

## 2024-04-20 NOTE — Progress Notes (Signed)
 "  Established Patient Office Visit  Subjective:  Patient ID: Kimberly [name], female    DOB: 08-Apr-1976  Age: 49 y.o. MRN: [id number]  Chief Complaint  Patient presents with   weight management follow up   anxiety/depression follow up    HPI  Discussed the use of AI scribe software for clinical note transcription with the patient, who gave verbal consent to proceed.  History of Present Illness  Kimberly [name] is a 49 year old female who presents with improved mood and energy levels after starting Vyvanse .  She has experienced a significant improvement in mood and energy levels since starting Vyvanse . Her energy has increased, and she no longer feels a mental 'cloud' that hinders her activities. She has become more active around the house, a change she attributes to the medication, especially during the holiday season.  She has been taking Vyvanse  and has noticed a decrease in her PHQ-9 score from 10 to 3. Her anxiety scale score also decreased from 12 to 5. Previously, she felt easily annoyed or irritated almost daily, but now experiences this only two to three days a week.  She has lost four pounds since starting the medication, which she attributes to improved mood and energy levels, allowing her to make healthier dietary choices. She has started paying more attention to her diet, opting for healthier options like grilled chicken sandwiches instead of French fries.  Her husband has noticed some changes as well. She believes the past two weeks have been particularly different, with the holidays over and a return to routine.  She has a history of feeling 'in a stall' for the past year, despite previous consultations with another doctor. She expresses concern about potential ADHD, given her history of distractions and difficulty focusing, but attributes some of her previous symptoms to perimenopause.  She has a seven-year-old daughter and mentions the challenges and joys of parenting at  this age.   Patient Active Problem List   Diagnosis Date Noted   Essential hypertension 10/08/2021   Migraine 10/08/2021   Major depression in partial remission 10/08/2021   Gastro-esophageal reflux disease without esophagitis 10/08/2021   Insomnia 10/08/2021   Body mass index (BMI) 40.0-44.9, adult (HCC) 10/08/2021   Anxiety 10/08/2021   Past Medical History:  Diagnosis Date   Anxiety    Depression    Disc degeneration, lumbar    Gallstones    Pregnancy induced hypertension    Past Surgical History:  Procedure Laterality Date   CHOLECYSTECTOMY     Social History[1]    ROS: as noted in HPI  Objective:     BP 127/82 (BP Location: Left Arm, Patient Position: Sitting, Cuff Size: Normal)   Pulse 68   Ht 5' 9 (1.753 m)   Wt 271 lb 12 oz (123.3 kg)   SpO2 97%   BMI 40.13 kg/m  BP Readings from Last 3 Encounters:  04/20/24 127/82  03/23/24 128/73  09/23/23 (!) 127/90   Wt Readings from Last 3 Encounters:  04/20/24 271 lb 12 oz (123.3 kg)  03/23/24 274 lb (124.3 kg)  09/23/23 272 lb (123.4 kg)      Physical Exam Vitals and nursing note reviewed.  Constitutional:      General: She is not in acute distress.    Appearance: Normal appearance. She is not ill-appearing, toxic-appearing or diaphoretic.  HENT:     Head: Normocephalic and atraumatic.  Eyes:     General: No scleral icterus.  Right eye: No discharge.        Left eye: No discharge.     Extraocular Movements: Extraocular movements intact.     Pupils: Pupils are equal, round, and reactive to light.  Cardiovascular:     Rate and Rhythm: Normal rate.  Pulmonary:     Effort: Pulmonary effort is normal. No respiratory distress.  Skin:    General: Skin is warm and dry.     Coloration: Skin is not jaundiced.     Findings: No bruising, erythema or rash.  Neurological:     General: No focal deficit present.     Mental Status: She is alert and oriented to person, place, and time.     Gait: Gait  normal.  Psychiatric:        Mood and Affect: Mood normal.        Behavior: Behavior normal.      No results found for any visits on 04/20/24.  Last CBC Lab Results  Component Value Date   WBC 9.4 08/25/2019   HGB 12.9 08/25/2019   HCT 37.2 08/25/2019   MCV 95.1 08/25/2019   MCH 33.0 08/25/2019   RDW 12.0 08/25/2019   PLT 326 08/25/2019   Last metabolic panel Lab Results  Component Value Date   GLUCOSE 104 (H) 12/16/2016   NA 135 12/16/2016   K 4.1 12/16/2016   CL 104 12/16/2016   CO2 22 12/16/2016   BUN 12 12/16/2016   CREATININE 0.59 12/16/2016   GFRNONAA >60 12/16/2016   CALCIUM 8.9 12/16/2016   PROT 7.0 12/16/2016   ALBUMIN 3.2 (L) 12/16/2016   BILITOT 0.3 12/16/2016   ALKPHOS 89 12/16/2016   AST 22 12/16/2016   ALT 15 12/16/2016   ANIONGAP 9 12/16/2016   Last lipids Lab Results  Component Value Date   CHOL 148 08/25/2019   HDL 40 (L) 08/25/2019   LDLCALC 74 08/25/2019   TRIG 258 (H) 08/25/2019   CHOLHDL 3.7 08/25/2019   Last hemoglobin A1c Lab Results  Component Value Date   HGBA1C 5.4 08/25/2019   Last thyroid functions Lab Results  Component Value Date   TSH 1.64 08/25/2019   Last vitamin D No results found for: 25OHVITD2, 25OHVITD3, VD25OH Last vitamin B12 and Folate No results found for: VITAMINB12, FOLATE    The 10-year ASCVD risk score (Arnett DK, et al., 2019) is: 1.4%  Assessment & Plan:  Mixed anxiety depressive disorder  Perimenopausal  BMI 40.0-44.9, adult (HCC)  Essential hypertension  Concentration deficit    Assessment and Plan  Depression and anxiety Significant improvement in symptoms with Vyvanse . PHQ-9 decreased from 10 to 3, anxiety scale from 12 to 5. Increased energy, improved mood, reduced desperation and irritability. 4-pound weight loss noted. - Continue Vyvanse  at current dose. - Consider trial of doubling Vyvanse  dose for 3-4 days to assess further improvement, monitor for side effects. -  Monitor symptoms and report significant changes. - Reassess in 3-4 months with questionnaire to evaluate potential reduction in Cymbalta  or Buspar .  Attention deficit disorder Improved attention and concentration with Vyvanse . Increased focus on tasks and dietary choices, indicating benefit for ADHD symptoms. - Continue Vyvanse  for ADHD symptoms.  BMI 40 Weight reduction of four pounds noted. Encouraged continued lifestyle modification and increased physical activity.  HTN Well controlled and stable  General health maintenance Annual physical and fasting labs discussed. Pap smear completed in June. - Schedule annual physical and fasting labs around June. - Ensure new bottle of Vyvanse  before starting  higher dose.   No follow-ups on file.   Benton LITTIE Gave, PA    [1]  Social History Tobacco Use   Smoking status: Never   Smokeless tobacco: Never  Vaping Use   Vaping status: Never Used  Substance Use Topics   Alcohol use: Not Currently    Alcohol/week: 1.0 standard drink of alcohol    Types: 1 Standard drinks or equivalent per week   Drug use: Never   "

## 2024-04-21 ENCOUNTER — Encounter: Payer: Self-pay | Admitting: Urgent Care

## 2024-04-21 NOTE — Patient Instructions (Signed)
 Continue Vyvanse . Do a trial week of doubling to = 20mg  and let me know if this is tolerated with continued improved symptoms, would call in higher dose.  Follow up in June for annual labs, sooner if needed.

## 2024-04-27 ENCOUNTER — Other Ambulatory Visit: Payer: Self-pay | Admitting: Obstetrics and Gynecology

## 2024-04-27 DIAGNOSIS — I10 Essential (primary) hypertension: Secondary | ICD-10-CM

## 2024-05-03 ENCOUNTER — Encounter: Payer: Self-pay | Admitting: Urgent Care

## 2024-05-03 DIAGNOSIS — I10 Essential (primary) hypertension: Secondary | ICD-10-CM

## 2024-05-03 MED ORDER — HYDROCHLOROTHIAZIDE 12.5 MG PO TABS: 12.5000 mg | ORAL_TABLET | Freq: Every day | ORAL | 1 refills | Status: AC

## 2024-05-03 MED ORDER — VALSARTAN 320 MG PO TABS: 320.0000 mg | ORAL_TABLET | Freq: Every day | ORAL | 1 refills | Status: AC

## 2024-05-05 ENCOUNTER — Encounter: Payer: Self-pay | Admitting: Urgent Care

## 2024-05-05 DIAGNOSIS — R41840 Attention and concentration deficit: Secondary | ICD-10-CM

## 2024-05-06 MED ORDER — LISDEXAMFETAMINE DIMESYLATE 20 MG PO CAPS: 20.0000 mg | ORAL_CAPSULE | Freq: Every day | ORAL | 0 refills | Status: AC

## 2024-09-21 ENCOUNTER — Encounter: Admitting: Urgent Care
